# Patient Record
Sex: Male | Born: 1945 | Race: White | Hispanic: No | Marital: Married | Smoking: Former smoker
Health system: Southern US, Community
[De-identification: ages and names within clinical notes are randomized; demographics above are authoritative.]

## PROBLEM LIST (undated history)

## (undated) DIAGNOSIS — E039 Hypothyroidism, unspecified: Secondary | ICD-10-CM

## (undated) DIAGNOSIS — D649 Anemia, unspecified: Secondary | ICD-10-CM

## (undated) DIAGNOSIS — K219 Gastro-esophageal reflux disease without esophagitis: Secondary | ICD-10-CM

## (undated) DIAGNOSIS — M1990 Unspecified osteoarthritis, unspecified site: Secondary | ICD-10-CM

## (undated) DIAGNOSIS — C4490 Unspecified malignant neoplasm of skin, unspecified: Secondary | ICD-10-CM

## (undated) DIAGNOSIS — J90 Pleural effusion, not elsewhere classified: Secondary | ICD-10-CM

## (undated) DIAGNOSIS — I10 Essential (primary) hypertension: Secondary | ICD-10-CM

## (undated) DIAGNOSIS — F32A Depression, unspecified: Secondary | ICD-10-CM

## (undated) DIAGNOSIS — D696 Thrombocytopenia, unspecified: Secondary | ICD-10-CM

## (undated) DIAGNOSIS — D7589 Other specified diseases of blood and blood-forming organs: Secondary | ICD-10-CM

## (undated) DIAGNOSIS — I2510 Atherosclerotic heart disease of native coronary artery without angina pectoris: Secondary | ICD-10-CM

## (undated) DIAGNOSIS — E785 Hyperlipidemia, unspecified: Secondary | ICD-10-CM

## (undated) DIAGNOSIS — E119 Type 2 diabetes mellitus without complications: Secondary | ICD-10-CM

## (undated) DIAGNOSIS — D72819 Decreased white blood cell count, unspecified: Principal | ICD-10-CM

## (undated) DIAGNOSIS — F329 Major depressive disorder, single episode, unspecified: Secondary | ICD-10-CM

## (undated) HISTORY — DX: Gastro-esophageal reflux disease without esophagitis: K21.9

## (undated) HISTORY — DX: Atherosclerotic heart disease of native coronary artery without angina pectoris: I25.10

## (undated) HISTORY — DX: Hyperlipidemia, unspecified: E78.5

## (undated) HISTORY — DX: Thrombocytopenia, unspecified: D69.6

## (undated) HISTORY — DX: Decreased white blood cell count, unspecified: D72.819

## (undated) HISTORY — PX: UMBILICAL HERNIA REPAIR: SHX196

## (undated) HISTORY — DX: Essential (primary) hypertension: I10

## (undated) HISTORY — DX: Other specified diseases of blood and blood-forming organs: D75.89

## (undated) HISTORY — DX: Unspecified osteoarthritis, unspecified site: M19.90

## (undated) HISTORY — PX: HERNIA REPAIR: SHX51

## (undated) HISTORY — PX: SKIN CANCER EXCISION: SHX779

## (undated) HISTORY — DX: Hypothyroidism, unspecified: E03.9

---

## 1964-04-10 HISTORY — PX: TONSILLECTOMY: SUR1361

## 1998-02-19 ENCOUNTER — Ambulatory Visit (HOSPITAL_BASED_OUTPATIENT_CLINIC_OR_DEPARTMENT_OTHER): Admission: RE | Admit: 1998-02-19 | Discharge: 1998-02-19 | Payer: Self-pay | Admitting: *Deleted

## 1998-02-26 ENCOUNTER — Ambulatory Visit (HOSPITAL_COMMUNITY): Admission: RE | Admit: 1998-02-26 | Discharge: 1998-02-26 | Payer: Self-pay | Admitting: *Deleted

## 1998-06-09 ENCOUNTER — Other Ambulatory Visit: Admission: RE | Admit: 1998-06-09 | Discharge: 1998-06-09 | Payer: Self-pay | Admitting: Family Medicine

## 1998-07-24 ENCOUNTER — Inpatient Hospital Stay (HOSPITAL_COMMUNITY): Admission: EM | Admit: 1998-07-24 | Discharge: 1998-07-26 | Payer: Self-pay | Admitting: Emergency Medicine

## 1998-07-24 ENCOUNTER — Encounter: Payer: Self-pay | Admitting: Emergency Medicine

## 2002-08-11 ENCOUNTER — Encounter: Admission: RE | Admit: 2002-08-11 | Discharge: 2002-08-11 | Payer: Self-pay | Admitting: Family Medicine

## 2002-08-11 ENCOUNTER — Encounter: Payer: Self-pay | Admitting: Family Medicine

## 2003-12-08 ENCOUNTER — Encounter: Admission: RE | Admit: 2003-12-08 | Discharge: 2003-12-08 | Payer: Self-pay | Admitting: Family Medicine

## 2003-12-16 ENCOUNTER — Encounter: Admission: RE | Admit: 2003-12-16 | Discharge: 2004-03-15 | Payer: Self-pay | Admitting: Family Medicine

## 2006-08-07 ENCOUNTER — Encounter: Admission: RE | Admit: 2006-08-07 | Discharge: 2006-08-07 | Payer: Self-pay | Admitting: Family Medicine

## 2006-09-06 ENCOUNTER — Encounter: Admission: RE | Admit: 2006-09-06 | Discharge: 2006-09-06 | Payer: Self-pay | Admitting: Family Medicine

## 2009-01-06 ENCOUNTER — Inpatient Hospital Stay (HOSPITAL_BASED_OUTPATIENT_CLINIC_OR_DEPARTMENT_OTHER): Admission: RE | Admit: 2009-01-06 | Discharge: 2009-01-06 | Payer: Self-pay | Admitting: Interventional Cardiology

## 2009-01-08 HISTORY — PX: CORONARY ANGIOPLASTY WITH STENT PLACEMENT: SHX49

## 2009-01-11 ENCOUNTER — Ambulatory Visit (HOSPITAL_COMMUNITY): Admission: RE | Admit: 2009-01-11 | Discharge: 2009-01-12 | Payer: Self-pay | Admitting: Interventional Cardiology

## 2010-03-27 ENCOUNTER — Emergency Department (HOSPITAL_COMMUNITY)
Admission: EM | Admit: 2010-03-27 | Discharge: 2010-03-27 | Payer: Self-pay | Source: Home / Self Care | Admitting: Emergency Medicine

## 2010-04-05 ENCOUNTER — Emergency Department (HOSPITAL_COMMUNITY)
Admission: EM | Admit: 2010-04-05 | Discharge: 2010-04-05 | Payer: Self-pay | Source: Home / Self Care | Admitting: Family Medicine

## 2010-05-01 ENCOUNTER — Encounter: Payer: Self-pay | Admitting: Family Medicine

## 2010-07-14 LAB — GLUCOSE, CAPILLARY
Glucose-Capillary: 103 mg/dL — ABNORMAL HIGH (ref 70–99)
Glucose-Capillary: 155 mg/dL — ABNORMAL HIGH (ref 70–99)
Glucose-Capillary: 179 mg/dL — ABNORMAL HIGH (ref 70–99)
Glucose-Capillary: 208 mg/dL — ABNORMAL HIGH (ref 70–99)

## 2010-07-14 LAB — BASIC METABOLIC PANEL
BUN: 14 mg/dL (ref 6–23)
Creatinine, Ser: 0.79 mg/dL (ref 0.4–1.5)
GFR calc non Af Amer: >60 mL/min (ref >60)

## 2010-07-14 LAB — CBC
Platelets: 61 10*3/uL — ABNORMAL LOW (ref 150–400)
WBC: 4.4 10*3/uL (ref 4.0–10.5)

## 2010-08-31 ENCOUNTER — Ambulatory Visit (HOSPITAL_COMMUNITY)
Admission: RE | Admit: 2010-08-31 | Payer: Medicare Other | Source: Ambulatory Visit | Admitting: Interventional Cardiology

## 2011-08-29 ENCOUNTER — Telehealth: Payer: Self-pay | Admitting: Oncology

## 2011-08-29 NOTE — Telephone Encounter (Signed)
Talked to pt, gave him appt time and location, he is aware of appt

## 2011-08-30 ENCOUNTER — Telehealth: Payer: Self-pay | Admitting: Oncology

## 2011-08-30 NOTE — Telephone Encounter (Signed)
Referred by Dr. Robert Ehinger Dx- Leukopenia/Thrombocytopenia 

## 2011-08-31 ENCOUNTER — Telehealth: Payer: Self-pay | Admitting: Oncology

## 2011-08-31 NOTE — Telephone Encounter (Signed)
Referred by Dr. Blair Heys Dx- Leukopenia/Thrombocytopenia

## 2011-09-01 ENCOUNTER — Encounter: Payer: Self-pay | Admitting: Oncology

## 2011-09-01 ENCOUNTER — Other Ambulatory Visit: Payer: Self-pay | Admitting: Oncology

## 2011-09-01 DIAGNOSIS — D696 Thrombocytopenia, unspecified: Secondary | ICD-10-CM | POA: Insufficient documentation

## 2011-09-01 DIAGNOSIS — D7589 Other specified diseases of blood and blood-forming organs: Secondary | ICD-10-CM

## 2011-09-01 DIAGNOSIS — D72819 Decreased white blood cell count, unspecified: Secondary | ICD-10-CM

## 2011-09-01 HISTORY — DX: Thrombocytopenia, unspecified: D69.6

## 2011-09-01 HISTORY — DX: Other specified diseases of blood and blood-forming organs: D75.89

## 2011-09-01 HISTORY — DX: Decreased white blood cell count, unspecified: D72.819

## 2011-09-06 ENCOUNTER — Ambulatory Visit: Payer: Medicare Other

## 2011-09-06 ENCOUNTER — Ambulatory Visit (HOSPITAL_BASED_OUTPATIENT_CLINIC_OR_DEPARTMENT_OTHER): Payer: Medicare Other | Admitting: Oncology

## 2011-09-06 ENCOUNTER — Encounter: Payer: Self-pay | Admitting: Oncology

## 2011-09-06 ENCOUNTER — Telehealth: Payer: Self-pay | Admitting: Oncology

## 2011-09-06 ENCOUNTER — Other Ambulatory Visit (HOSPITAL_BASED_OUTPATIENT_CLINIC_OR_DEPARTMENT_OTHER): Payer: Medicare Other | Admitting: Lab

## 2011-09-06 ENCOUNTER — Telehealth: Payer: Self-pay

## 2011-09-06 VITALS — BP 148/84 | HR 76 | Temp 97.0°F | Ht 67.5 in | Wt 240.7 lb

## 2011-09-06 DIAGNOSIS — D7589 Other specified diseases of blood and blood-forming organs: Secondary | ICD-10-CM

## 2011-09-06 DIAGNOSIS — D696 Thrombocytopenia, unspecified: Secondary | ICD-10-CM

## 2011-09-06 DIAGNOSIS — D72819 Decreased white blood cell count, unspecified: Secondary | ICD-10-CM

## 2011-09-06 LAB — MORPHOLOGY: RBC Comments: NORMAL

## 2011-09-06 LAB — CBC & DIFF AND RETIC
BASO%: 0.6 % (ref 0.0–2.0)
EOS%: 2.4 % (ref 0.0–7.0)
LYMPH%: 26.5 % (ref 14.0–49.0)
MCH: 31.2 pg (ref 27.2–33.4)
MCHC: 34.8 g/dL (ref 32.0–36.0)
MONO#: 0.3 10*3/uL (ref 0.1–0.9)
Platelets: 74 10*3/uL — ABNORMAL LOW (ref 140–400)
RBC: 4.68 10*6/uL (ref 4.20–5.82)
Retic %: 1.84 % — ABNORMAL HIGH (ref 0.80–1.80)
WBC: 3.3 10*3/uL — ABNORMAL LOW (ref 4.0–10.3)
lymph#: 0.9 10*3/uL (ref 0.9–3.3)

## 2011-09-06 NOTE — Telephone Encounter (Signed)
Gv pt appt for nov2013 °

## 2011-09-06 NOTE — Progress Notes (Signed)
New patient today, patient has insurance, patient stated that at this time because he has insurance he did not think he needed any assistance gave patient my contact information if anything changes.

## 2011-09-06 NOTE — Progress Notes (Signed)
New Patient Hematology-Oncology Evaluation   Randy Macdonald [id number] 02-01-1946 66 y.o. 09/06/2011  CC: Dr. 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 Laboratory data provided back as far as 12/30/2008 shows a mild leukopenia with white count 3600 and mild thrombocytopenia with platelet count 74,000 with a normal hemoglobin of 14.6 g. Multiple lab values over the last 3 years since that time have been similar. In October 2010 white count 3300, platelets 88,000, in September 2011 white count 4100, platelets 69,000; in may of 2012 white count 3100, platelets 63,000. On 08/22/2011 white count 2800, platelets 62,000, MCV 95, hemoglobin 14, hematocrit 42%. In our office today, hemoglobin 14.6, hematocrit 41.9, MCV 89.5, white count 3300, 61% neutrophils, 27% lymphocytes, 9 monocytes, 2 eosinophils, platelet count 74,000, reticulocyte count 1.8%. A constant feature has been his normal hemoglobin and, although decreased, white count and platelet count have not shown a trend for progressive decline over time. He has a normal white count differential. He denies any history of a severe viral infection, hepatitis, yellow jaundice, malaria, and liver functions have been normal. He had some recent gastrointestinal symptoms with constipation and increased gas. He takes diclofenac for degenerative arthritis. He was instructed to stop this medication and his GI symptoms promptly resolved. Regular x-rays of the abdomen were unremarkable. He has degenerative arthritis primarily affecting his back. He has no signs or symptoms of a collagen vascular disorder. He does fatigue easily but has no other constitutional symptoms and has not lost any weight. He has not noted any easy  bruising, any abnormal bleeding, any swollen glands. He has had heavy exposure to diesel fuel and organic solvents over the years in his job as a Curator. He is a nonsmoker. He does not use alcohol. There is no family history of any blood disorder. He has no history of blood transfusions.   PMH: Past Medical History  Diagnosis Date  . Leukopenia 09/01/2011  . Thrombocytopenia 09/01/2011  . Macrocytosis without anemia 09/01/2011  No history of myocardial infarction, he has a hiatus hernia with reflux, hypothyroid on replacement, degenerative arthritis. Coronary artery disease status post coronary stent x2 approximately 2011, insulin-dependent diabetes. Depression.   surgical history: Tonsillectomy at age 65. Umbilicus hernia repair with removal of his navel.  Allergies: Allergies  Allergen Reactions  . Codeine Nausea And Vomiting  . Sulfa Antibiotics Nausea And Vomiting    Medications: Celexa order milligrams daily, Plavix 75 mg daily, Crestor 40 mg daily, Cozaar 50 mg daily, long acting and short acting insulin doses not recorded.   Social History: Married. Retired Curator. No alcohol. No tobacco. Son age 33 daughter age 35 healthy. His brother-in-law is Carnella Guadalajara who runs the music aren't here in [place].  Family History: He has a brother who had bypass surgery for coronary artery disease. A sister has fibromyalgia. There is no family history of any bleeding disorder.  Review of Systems: Constitutional symptoms: He fatigues easily. No weight loss. No fevers. No night sweats. HEENT: He gets a strange buzzing sensation in his ears left greater than right at night when he lays his head on the pillow. Respiratory: No dyspnea on exertion but not at rest. No PND. No orthopnea. Cardiovascular:  No chest pain or palpitations since coronary stents placed 2 years ago Gastrointestinal  ROS: Recent GI symptoms as noted above have now resolved. Remote colonoscopy about 15 years ago with  removal of a benign polyp. He wound up in the hospital with colitis for a few days after the procedure. Genito-Urinary ROS: No urinary tract symptoms. Nocturia x1. Hematological and Lymphatic: Musculoskeletal: See above. He does get some migratory myalgias particularly in the muscles of his left forearm Neurologic: No headache or change in vision, Dermatologic: No rash or ecchymoses. Miscellaneous: No history of recurrent infections. Remaining ROS negative.  Physical Exam: Blood pressure 148/84, pulse 76, temperature 97 F (36.1 C), temperature source Oral, height 5' 7.5" (1.715 m), weight 240 lb 11.2 oz (109.181 kg). Wt Readings from Last 3 Encounters:  09/06/11 240 lb 11.2 oz (109.181 kg)    General appearance: Well-nourished Caucasian man Head:  normal Neck: Full range of motion Lymph nodes: No cervical supraclavicular axillary or inguinal adenopathy Breasts: Lungs: Clear to auscultation resonant to percussion Heart: Regular rhythm no murmur Abdominal: Obese nontender, surgically absent navel, no obvious mass or organomegaly GU: Not examined Extremities: No edema no calf tenderness Neurologic: Mental status intact, cranial nerves intact, 2 pills reactive to light, optic disc sharp, motor strength 5 over 5, reflexes 1+ symmetric, moderate to severe decrease in vibration sensation over the fingertips by tuning fork exam Skin: No rash or ecchymoses    Lab Results: Lab Results  Component Value Date   WBC 3.3* 09/06/2011   HGB 14.6 09/06/2011   HCT 41.9 09/06/2011   MCV 89.5 09/06/2011   PLT 74* 09/06/2011     Chemistry      Component Value Date/Time   NA 136 01/12/2009 0425   K 3.7 01/12/2009 0425   CL 103 01/12/2009 0425   CO2 27 01/12/2009 0425   BUN 14 01/12/2009 0425   CREATININE 0.79 01/12/2009 0425      Component Value Date/Time   CALCIUM 8.5 01/12/2009 0425       Review of peripheral blood film:  Normochromic normocytic red cells, mature neutrophils and lymphocytes,  a subpopulation of benign reactive lymphocytes of the large granular type seen with previous or current viral infection. Decreased platelets with normal morphology and granulation. No immature cells.    Impression and Plan: Chronic mild leukopenia and thrombocytopenia without concomitant anemia with no trend for progressive decline over a three-year interval of observation. Normal white count differential. He is asymptomatic and has not had any problems with bleeding or recurrent infection. Review of the peripheral blood is unremarkable.  Etiology of his cytopenias is unclear. It would be very unusual not to have anemia with a underlying myelodysplastic syndrome or multiple myeloma. Although non-Hodgkin's lymphoma would be in my differential, he has no peripheral lymphadenopathy, no organomegaly on exam, and no progressive symptoms over the last 3 years to suggest this. He has had significant exposure to toxic chemicals over the years. However, as stated above, it would be unusual to develop a myelodysplastic syndrome and  not have an associated anemia as well. He is on a number of medications and medication effects on the bone marrow must always be considered. Isolated cytopenias can be seen with many medications. Although none the medicines he is on now are obvious usual offenders, I would consider tapering then stopping his Celexa for one month to see if this has any effect on his blood production.  We discussed the value of doing a bone marrow biopsy and a CT scan of his abdomen at this time. Since he  is asymptomatic  and we have not seen progressive changes in his blood counts now for over 3 years, we mutually decided to wait for 6 months and reevaluate.      Levert Feinstein, MD 09/06/2011, 1:24 PM

## 2011-09-06 NOTE — Telephone Encounter (Signed)
Pt notified of results per Dr Cyndie Chime.  dph

## 2011-09-06 NOTE — Telephone Encounter (Signed)
Message copied by Albertha Ghee on Wed Sep 06, 2011  4:13 PM ------      Message from: Levert Feinstein      Created: Wed Sep 06, 2011  3:55 PM       Call pt  [phone]  He is a known diabetic on insulin - he will know what to do  [employer]

## 2011-09-07 LAB — COMPREHENSIVE METABOLIC PANEL
ALT: 37 U/L (ref 0–53)
AST: 27 U/L (ref 0–37)
Albumin: 4.2 g/dL (ref 3.5–5.2)
Alkaline Phosphatase: 64 U/L (ref 39–117)
Glucose, Bld: 270 mg/dL — ABNORMAL HIGH (ref 70–99)
Potassium: 4.2 mEq/L (ref 3.5–5.3)
Sodium: 137 mEq/L (ref 135–145)
Total Bilirubin: 1 mg/dL (ref 0.3–1.2)
Total Protein: 6.4 g/dL (ref 6.0–8.3)

## 2011-09-07 LAB — SEDIMENTATION RATE: Sed Rate: 1 mm/hr (ref 0–16)

## 2012-03-05 ENCOUNTER — Ambulatory Visit: Payer: Medicare Other | Admitting: Oncology

## 2012-03-05 ENCOUNTER — Other Ambulatory Visit: Payer: Medicare Other | Admitting: Lab

## 2012-05-06 ENCOUNTER — Telehealth: Payer: Self-pay | Admitting: Oncology

## 2012-05-06 ENCOUNTER — Other Ambulatory Visit (HOSPITAL_BASED_OUTPATIENT_CLINIC_OR_DEPARTMENT_OTHER): Payer: Medicare Other | Admitting: Lab

## 2012-05-06 ENCOUNTER — Ambulatory Visit (HOSPITAL_BASED_OUTPATIENT_CLINIC_OR_DEPARTMENT_OTHER): Payer: Medicare Other | Admitting: Oncology

## 2012-05-06 VITALS — BP 153/85 | HR 81 | Temp 97.8°F | Resp 20 | Ht 67.5 in | Wt 247.5 lb

## 2012-05-06 DIAGNOSIS — D696 Thrombocytopenia, unspecified: Secondary | ICD-10-CM

## 2012-05-06 DIAGNOSIS — D72819 Decreased white blood cell count, unspecified: Secondary | ICD-10-CM

## 2012-05-06 LAB — CBC WITH DIFFERENTIAL/PLATELET
Basophils Absolute: 0 10*3/uL (ref 0.0–0.1)
Eosinophils Absolute: 0.1 10*3/uL (ref 0.0–0.5)
HCT: 42.3 % (ref 38.4–49.9)
HGB: 14.6 g/dL (ref 13.0–17.1)
LYMPH%: 25.8 % (ref 14.0–49.0)
MCV: 90.4 fL (ref 79.3–98.0)
MONO#: 0.4 10*3/uL (ref 0.1–0.9)
MONO%: 11 % (ref 0.0–14.0)
NEUT#: 2.2 10*3/uL (ref 1.5–6.5)
Platelets: 61 10*3/uL — ABNORMAL LOW (ref 140–400)
WBC: 3.7 10*3/uL — ABNORMAL LOW (ref 4.0–10.3)

## 2012-05-06 LAB — MORPHOLOGY: PLT EST: DECREASED

## 2012-05-06 LAB — CHCC SMEAR

## 2012-05-06 NOTE — Patient Instructions (Signed)
Return visit in September

## 2012-05-06 NOTE — Telephone Encounter (Signed)
Gave pt appt for lab and MD for September 2014

## 2012-05-06 NOTE — Progress Notes (Signed)
Hematology and Oncology Follow Up Visit  Randy Macdonald [id number] 10-21-1945 66 y.o. 05/06/2012 8:12 PM   Principle Diagnosis: Encounter Diagnoses  Name Primary?  . Leukopenia Yes  . Thrombocytopenia      Interim History:   followup visit for this pleasant 67 year old man I initially evaluated in May 2013 for chronic leukopenia and thrombocytopenia. Blood counts dating back as far as October 2010 showed normal hemoglobin but decreased white count 3300 and platelet count 88,000. I was able to find an additional blood count in the system from 07/24/1998:  hemoglobin was 17.6, hematocrit 50, white count 7200, and platelet count 59,000. This demonstrates that his platelet count has been at the current level for 14 years now. It is not clear when his white count fell but at least over the last 3 years values have been stable. He has a normal differential. No immature cells on review of the peripheral blood film. He has no signs or symptoms of a collagen vascular disorder. ANA was negative. ESR 1 mm. No known history of significant viral exposure, normal liver functions. No history of hepatitis yellow jaundice or malaria. We elected not to do an immediate bone marrow biopsy but continued to follow his counts. CBC today is stable compare with previous values with hemoglobin 14.6, hematocrit 42, MCV 90, white count 3700, 61 neutrophils, 26 lymphocytes, 11 monocytes, and platelet count 61,000.  He has had no interim medical problems.  Medications: reviewed  Allergies:  Allergies  Allergen Reactions  . Codeine Nausea And Vomiting  . Sulfa Antibiotics Nausea And Vomiting    Physical Exam: No exam was done today Blood pressure 153/85, pulse 81, temperature 97.8 F (36.6 C), temperature source Oral, resp. rate 20, height 5' 7.5" (1.715 m), weight 247 lb 8 oz (112.265 kg). Wt Readings from Last 3 Encounters:  05/06/12 247 lb 8 oz (112.265 kg)  09/06/11 240 lb 11.2 oz (109.181 kg)    Lab  Results: Lab Results  Component Value Date   WBC 3.7* 05/06/2012   HGB 14.6 05/06/2012   HCT 42.3 05/06/2012   MCV 90.4 05/06/2012   PLT 61* 05/06/2012     Chemistry      Component Value Date/Time   NA 137 09/06/2011 0948   K 4.2 09/06/2011 0948   CL 102 09/06/2011 0948   CO2 24 09/06/2011 0948   BUN 15 09/06/2011 0948   CREATININE 0.76 09/06/2011 0948      Component Value Date/Time   CALCIUM 9.1 09/06/2011 0948   ALKPHOS 64 09/06/2011 0948   AST 27 09/06/2011 0948   ALT 37 09/06/2011 0948   BILITOT 1.0 09/06/2011 0948       Impression and Plan: Chronic leukopenia and thrombocytopenia without anemia. Etiology remains unclear but counts remained at his baseline. I will continue to follow him periodically over time. Bone marrow biopsy if any trend for deterioration in his counts.   CC:.    Levert Feinstein, MD 1/27/20148:12 PM

## 2012-11-05 ENCOUNTER — Other Ambulatory Visit: Payer: Medicare Other | Admitting: Lab

## 2013-01-02 ENCOUNTER — Telehealth: Payer: Self-pay | Admitting: Oncology

## 2013-01-02 NOTE — Telephone Encounter (Signed)
Pt has to be in court, did not r/s appt,nurse notified

## 2013-01-06 ENCOUNTER — Ambulatory Visit: Payer: Medicare Other | Admitting: Oncology

## 2013-01-06 ENCOUNTER — Other Ambulatory Visit: Payer: Medicare Other | Admitting: Lab

## 2013-01-14 ENCOUNTER — Telehealth: Payer: Self-pay | Admitting: *Deleted

## 2013-01-14 DIAGNOSIS — D72819 Decreased white blood cell count, unspecified: Secondary | ICD-10-CM

## 2013-01-14 DIAGNOSIS — D696 Thrombocytopenia, unspecified: Secondary | ICD-10-CM

## 2013-01-14 MED ORDER — LOSARTAN POTASSIUM 100 MG PO TABS: 100.0000 mg | ORAL_TABLET | Freq: Every day | ORAL | Status: DC

## 2013-01-14 NOTE — Telephone Encounter (Signed)
Pt. called requesting 90 day refill on Losartan to be sent to [facility] on [address].

## 2013-01-14 NOTE — Telephone Encounter (Signed)
Refilled medication

## 2013-01-21 ENCOUNTER — Ambulatory Visit: Payer: Medicare Other | Admitting: Interventional Cardiology

## 2013-01-24 ENCOUNTER — Encounter: Payer: Self-pay | Admitting: *Deleted

## 2013-01-24 ENCOUNTER — Encounter: Payer: Self-pay | Admitting: Interventional Cardiology

## 2013-01-24 DIAGNOSIS — E785 Hyperlipidemia, unspecified: Secondary | ICD-10-CM | POA: Insufficient documentation

## 2013-01-24 DIAGNOSIS — I2510 Atherosclerotic heart disease of native coronary artery without angina pectoris: Secondary | ICD-10-CM | POA: Insufficient documentation

## 2013-01-24 DIAGNOSIS — E039 Hypothyroidism, unspecified: Secondary | ICD-10-CM | POA: Insufficient documentation

## 2013-01-24 DIAGNOSIS — K219 Gastro-esophageal reflux disease without esophagitis: Secondary | ICD-10-CM | POA: Insufficient documentation

## 2013-01-24 DIAGNOSIS — E119 Type 2 diabetes mellitus without complications: Secondary | ICD-10-CM | POA: Insufficient documentation

## 2013-01-28 ENCOUNTER — Ambulatory Visit (INDEPENDENT_AMBULATORY_CARE_PROVIDER_SITE_OTHER): Payer: Medicare Other | Admitting: Interventional Cardiology

## 2013-01-28 ENCOUNTER — Encounter: Payer: Self-pay | Admitting: Interventional Cardiology

## 2013-01-28 VITALS — BP 118/68 | HR 75 | Ht 67.0 in | Wt 243.8 lb

## 2013-01-28 DIAGNOSIS — E669 Obesity, unspecified: Secondary | ICD-10-CM

## 2013-01-28 DIAGNOSIS — I2510 Atherosclerotic heart disease of native coronary artery without angina pectoris: Secondary | ICD-10-CM

## 2013-01-28 DIAGNOSIS — E785 Hyperlipidemia, unspecified: Secondary | ICD-10-CM

## 2013-01-28 DIAGNOSIS — I10 Essential (primary) hypertension: Secondary | ICD-10-CM | POA: Insufficient documentation

## 2013-01-28 NOTE — Patient Instructions (Signed)
Your physician wants you to follow-up in: 1 year with Dr. Varanasi. You will receive a reminder letter in the mail two months in advance. If you don't receive a letter, please call our office to schedule the follow-up appointment.  Your physician recommends that you continue on your current medications as directed. Please refer to the Current Medication list given to you today.  

## 2013-01-28 NOTE — Progress Notes (Signed)
Patient ID: Randy Macdonald, male   DOB: 02-08-1946, 67 y.o.   MRN: [id number]    [address] [place], [place]  [address] Phone: [phone] Fax:  [phone]  Date:  01/28/2013   ID:  Randy Macdonald, DOB [birth year], MRN [id number]  PCP:  Thora Lance, MD      History of Present Illness: Randy Macdonald is a 67 y.o. male who ahs had CAD and HTN.  He has had an LAD DES several years ago. He has not been checking his BP. He reports easy bruising and nuisance bleeding. No blood in his stools. His stomach feels better after using metamucil.  He has a brother in law that stays with him, who has memory isses.  THis is a source of stress.  CAD/ASCVD:  He has felt very well. He walks regularly. Denies : Chest pain.  Diaphoresis.  Dizziness.  Fatigue.  Leg edema.  Nitroglycerin.  Orthopnea.  Palpitations.  Shortness of breath.  Syncope.     Wt Readings from Last 3 Encounters:  01/28/13 243 lb 12.8 oz (110.587 kg)  05/06/12 247 lb 8 oz (112.265 kg)  09/06/11 240 lb 11.2 oz (109.181 kg)     Past Medical History  Diagnosis Date  . Leukopenia 09/01/2011  . Thrombocytopenia 09/01/2011  . Macrocytosis without anemia 09/01/2011  . Hyperlipidemia   . Coronary atherosclerosis of native coronary artery   . Hypothyroidism   . Diabetes   . GERD (gastroesophageal reflux disease)   . HTN (hypertension)   . Osteoarthritis     Current Outpatient Prescriptions  Medication Sig Dispense Refill  . aspirin 81 MG tablet Take 81 mg by mouth daily.      . citalopram (CELEXA) 40 MG tablet 40 mg daily.       . CRESTOR 40 MG tablet 40 mg daily.       . diclofenac (VOLTAREN) 75 MG EC tablet Take 75 mg by mouth daily.      . fish oil-omega-3 fatty acids 1000 MG capsule 1,200 mg daily.      Marland Kitchen levothyroxine (SYNTHROID, LEVOTHROID) 75 MCG tablet Take 75 mcg by mouth daily.      Marland Kitchen losartan (COZAAR) 100 MG tablet Take 1 tablet (100 mg total) by mouth daily.  90 tablet  1  . metFORMIN  (GLUCOPHAGE-XR) 500 MG 24 hr tablet Take 500 mg by mouth 2 (two) times daily.       Marland Kitchen NOVOLIN N RELION 100 UNIT/ML injection Inject 75 Units into the skin daily before breakfast.       . NOVOLIN R RELION 100 UNIT/ML injection Inject 60 Units into the skin 3 (three) times daily before meals.        No current facility-administered medications for this visit.    Allergies:    Allergies  Allergen Reactions  . Codeine Nausea And Vomiting  . Sulfa Antibiotics Nausea And Vomiting    Social History:  The patient  reports that he has never smoked. He does not have any smokeless tobacco history on file.   Family History:  The patient's family history includes CAD in his brother; Thyroid disease in his sister.   ROS:  Please see the history of present illness.  No nausea, vomiting.  No fevers, chills.  No focal weakness.  No dysuria.    All other systems reviewed and negative.   PHYSICAL EXAM: VS:  BP 118/68  Pulse 75  Ht 5\' 7"  (1.702 m)  Wt  243 lb 12.8 oz (110.587 kg)  BMI 38.18 kg/m2 Well nourished, well developed, in no acute distress HEENT: normal Neck: no JVD, no carotid bruits Cardiac:  normal S1, S2; RRR;  Lungs:  clear to auscultation bilaterally, no wheezing, rhonchi or rales Abd: soft, nontender, no hepatomegaly, obese Ext: no edema Skin: warm and dry Neuro:   no focal abnormalities noted  EKG:  Normal   ASSESSMENT AND PLAN:  Coronary atherosclerosis of native coronary artery  Start Aspirin Tablet, 81 MG, 1 tablet, Orally, every other day Stop Plavix Tablet, 75 MG, 1 tablet, Orally, Once a day Diagnostic Imaging:EKG Harward,Amy 01/19/2012 02:07:59 PM > Kariem Wolfson,JAY 01/19/2012 02:31:26 PM > normal  Angina resolved even with heavy labor.  It has been four years since stent. He had a lot of nuisance bleeding on plavix. switched back to aspirin.    2. Mixed hyperlipidemia  Continue Crestor Tablet, 40 mg, 1 tablet, Orally, Once a day Continue Fish Oil Capsule, 1200 MG, 2  capsules, Orally, twice a day TG better on last check. LDL controlled , 84 in 3/13. LDL 96; TG 162 in 3/14.   3. Essential hypertension, benign   continue Losartan Potassium Tablet, 100 MG, 1 tablet, Orally, Once a day, 30 day(s), 30 Target systolic BP < 130/80. cough resolved.    4. Obesity, unspecified  COntinue to try to lose weight with diet and exericise. I would recommend that he tries to exercise.    Preventive Medicine  Adult topics discussed:  Diet: weight loss, healthy diet.  Exercise: 5 days a week, at least 30 minutes of aerobic exercise.      Signed, Fredric Mare, MD, [facility] 01/28/2013 4:48 PM

## 2013-02-26 ENCOUNTER — Telehealth: Payer: Self-pay | Admitting: Interventional Cardiology

## 2013-02-26 NOTE — Telephone Encounter (Signed)
Spoke with patient to advise him that cardiac stents do not cause a problem with MRI and to make certain the facility is aware that he has these which were implanted in 2010.  Patient verbalized understanding and agreement.

## 2013-02-26 NOTE — Telephone Encounter (Signed)
New message      Have stents----need MRI of knee---can he have an MRI with stents?

## 2013-03-24 ENCOUNTER — Telehealth: Payer: Self-pay | Admitting: Interventional Cardiology

## 2013-03-24 NOTE — Telephone Encounter (Signed)
Received request from Nurse fax box, documents faxed for surgical clearance. To: [facility] Fax number: [phone] Attention: 03/24/13/KM

## 2013-05-14 ENCOUNTER — Telehealth: Payer: Self-pay | Admitting: Interventional Cardiology

## 2013-05-14 NOTE — Telephone Encounter (Signed)
I spoke with patient after I spoke with his insurance company, and explained that he has a $165 deductible, so he will pay $210 for the first 30 day supply, but every month afterwards it will cost him $45 like it did in the past.  He failed simvastaitn and atorvastatin (muscle aches) in the past.  Patient states that shouldn't be a problem, and he will go pick up his Crestor 40 mg today and get back on it.

## 2013-05-14 NOTE — Telephone Encounter (Signed)
Called pt to let him know there is not a generic crestor at this time. Pt didn't get crestor from the pharmacy because the pharmacy told him that it would cost him $208 for first rx but then possibly go down. Randy Macdonald, please advise.

## 2013-05-14 NOTE — Telephone Encounter (Signed)
New message  Patient wants to know if it is okay for him to take generic crestor? Please call and advise.

## 2013-05-16 ENCOUNTER — Other Ambulatory Visit (HOSPITAL_COMMUNITY): Payer: Medicare Other

## 2013-05-26 ENCOUNTER — Inpatient Hospital Stay: Admit: 2013-05-26 | Payer: Self-pay | Admitting: Orthopedic Surgery

## 2013-05-26 SURGERY — ARTHROPLASTY, KNEE, UNICOMPARTMENTAL
Anesthesia: General | Laterality: Right

## 2013-07-22 ENCOUNTER — Other Ambulatory Visit: Payer: Self-pay

## 2013-07-22 DIAGNOSIS — D696 Thrombocytopenia, unspecified: Secondary | ICD-10-CM

## 2013-07-22 DIAGNOSIS — D72819 Decreased white blood cell count, unspecified: Secondary | ICD-10-CM

## 2013-07-22 MED ORDER — LOSARTAN POTASSIUM 100 MG PO TABS: 100.0000 mg | ORAL_TABLET | Freq: Every day | ORAL | Status: DC

## 2014-01-05 ENCOUNTER — Other Ambulatory Visit: Payer: Self-pay | Admitting: Family Medicine

## 2014-01-05 DIAGNOSIS — R51 Headache: Secondary | ICD-10-CM

## 2014-01-12 ENCOUNTER — Ambulatory Visit
Admission: RE | Admit: 2014-01-12 | Discharge: 2014-01-12 | Disposition: A | Discharge status: Home / Self Care | Payer: Medicare Other | Source: Ambulatory Visit | Attending: Family Medicine | Admitting: Family Medicine

## 2014-01-12 DIAGNOSIS — R51 Headache: Secondary | ICD-10-CM

## 2014-01-12 MED ORDER — IOHEXOL 300 MG/ML  SOLN
75.0000 mL | Freq: Once | INTRAMUSCULAR | Status: AC | PRN
  Administered 2014-01-12: 75 mL via INTRAVENOUS

## 2014-01-30 ENCOUNTER — Ambulatory Visit: Payer: Medicare Other | Admitting: Interventional Cardiology

## 2014-02-05 ENCOUNTER — Ambulatory Visit: Payer: Medicare Other | Admitting: Interventional Cardiology

## 2014-03-11 ENCOUNTER — Encounter: Payer: Self-pay | Admitting: Interventional Cardiology

## 2014-03-11 ENCOUNTER — Ambulatory Visit (INDEPENDENT_AMBULATORY_CARE_PROVIDER_SITE_OTHER): Payer: Medicare Other | Admitting: Interventional Cardiology

## 2014-03-11 VITALS — BP 132/68 | HR 77 | Ht 67.0 in | Wt 239.8 lb

## 2014-03-11 DIAGNOSIS — E669 Obesity, unspecified: Secondary | ICD-10-CM

## 2014-03-11 DIAGNOSIS — I25119 Atherosclerotic heart disease of native coronary artery with unspecified angina pectoris: Secondary | ICD-10-CM

## 2014-03-11 DIAGNOSIS — E785 Hyperlipidemia, unspecified: Secondary | ICD-10-CM

## 2014-03-11 DIAGNOSIS — I10 Essential (primary) hypertension: Secondary | ICD-10-CM

## 2014-03-11 NOTE — Progress Notes (Signed)
Patient ID: Randy Macdonald, male   DOB: [birth year], 68 y.o.   MRN: [id number] Patient ID: BLAZE NYLUND, male   DOB: [birth year], 68 y.o.   MRN: [id number]    [address] [place], [place]  [address] Phone: [phone] Fax:  [phone]  Date:  03/11/2014   ID:  SAHMIR WEATHERBEE, DOB May 31, 1945, MRN [id number]  PCP:  Simona Huh, MD      History of Present Illness: EVANS LEVEE is a 68 y.o. male who ahs had CAD and HTN.  He has had an LAD DES in 2010. He has not been checking his BP at home. He reports mild bruising and nuisance bleeding, even on aspirin. No blood in his stools.  CAD/ASCVD:  He has felt very well. He walks regularly. He has had throat tightness when he starts walking up the hill.  It gets better with rest.  THis is similar to his sx prior to his stent in 2010.  Of note, he had a false negative cardiolite stress test at that time.   Denies : Diaphoresis.  Dizziness.   Nitroglycerin.  Orthopnea.  Palpitations.  Syncope.     Wt Readings from Last 3 Encounters:  03/11/14 239 lb 12.8 oz (108.773 kg)  01/28/13 243 lb 12.8 oz (110.587 kg)  05/06/12 247 lb 8 oz (112.265 kg)     Past Medical History  Diagnosis Date  . Leukopenia 09/01/2011  . Thrombocytopenia 09/01/2011  . Macrocytosis without anemia 09/01/2011  . Hyperlipidemia   . Coronary atherosclerosis of native coronary artery   . Hypothyroidism   . Diabetes   . GERD (gastroesophageal reflux disease)   . HTN (hypertension)   . Osteoarthritis     Current Outpatient Prescriptions  Medication Sig Dispense Refill  . aspirin 81 MG tablet Take 81 mg by mouth daily.    . citalopram (CELEXA) 40 MG tablet 40 mg daily.     . CRESTOR 40 MG tablet 40 mg daily.     . diclofenac (VOLTAREN) 75 MG EC tablet Take 75 mg by mouth daily.    . fish oil-omega-3 fatty acids 1000 MG capsule 1,200 mg daily.    . fluorouracil (EFUDEX) 5 % cream Apply topically as needed (Rash----Per patient).     Marland Kitchen levothyroxine  (SYNTHROID, LEVOTHROID) 75 MCG tablet Take 75 mcg by mouth daily.    Marland Kitchen losartan (COZAAR) 100 MG tablet Take 1 tablet (100 mg total) by mouth daily. 90 tablet 1  . metFORMIN (GLUCOPHAGE-XR) 500 MG 24 hr tablet Take 500 mg by mouth 2 (two) times daily.     Marland Kitchen NOVOLIN N RELION 100 UNIT/ML injection Inject 75 Units into the skin daily before breakfast.     . NOVOLIN R RELION 100 UNIT/ML injection Inject 60 Units into the skin 3 (three) times daily before meals.      No current facility-administered medications for this visit.    Allergies:    Allergies  Allergen Reactions  . Codeine Nausea And Vomiting  . Sulfa Antibiotics Nausea And Vomiting    Social History:  The patient  reports that he has never smoked. He does not have any smokeless tobacco history on file.   Family History:  The patient's family history includes CAD in his brother; Thyroid disease in his sister.   ROS:  Please see the history of present illness.  No nausea, vomiting.  No fevers, chills.  No focal weakness.  No dysuria.    All other  systems reviewed and negative.   PHYSICAL EXAM: VS:  BP 132/68 mmHg  Pulse 77  Ht 5\' 7"  (1.702 m)  Wt 239 lb 12.8 oz (108.773 kg)  BMI 37.55 kg/m2 Well nourished, well developed, in no acute distress HEENT: normal Neck: no JVD, no carotid bruits Cardiac:  normal S1, S2; RRR;  Lungs:  clear to auscultation bilaterally, no wheezing, rhonchi or rales Abd: soft, nontender, no hepatomegaly, obese Ext: mild edema edema Skin: warm and dry Neuro:   no focal abnormalities noted Psych: normal affect  EKG:  Normal   ASSESSMENT AND PLAN:  Coronary atherosclerosis of native coronary artery  Start Aspirin Tablet, 81 MG, 1 tablet, Orally, every other day Stopped Plavix Tablet, 75 MG, 1 tablet, Orally, Once a day  Angina returned as noted above. Worrisome for recurrent CAD.  He has had these sx on and of for a few years, but they have consistently returned now.  He is having to stop  activity due to the upper chest and throat tightness with activity.  He wants to know what the cost of the catheterization will be out of his pocket prior to scheduling.  It has been five years since stent. Doing ok after being switched back to aspirin. All questions were answered.   Radial approach would be preferred.     2. Mixed hyperlipidemia  Continue Crestor Tablet, 40 mg, 1 tablet, Orally, Once a day Continue Fish Oil Capsule, 1200 MG, 2 capsules, Orally, twice a day TG better on last check. LDL controlled , 84 in 3/13. LDL 96; TG 162 in 3/14.  Followed by Dr. Marisue Humble.  Was high at last checked.  He was not taking Crestor as scheduled due to cost. To be rechecked in 3/16.   3. Essential hypertension, benign   continue Losartan Potassium Tablet, 100 MG, 1 tablet, Orally, Once a day, 30 day(s), 30 Target systolic BP < 585/27. cough resolved.  Controlled today.   4. Obesity, unspecified  COntinue to try to lose weight with diet and exericise. I would recommend that he tries to exercise. He has lost weight since his last visit, although he is unsure why he has lost weight.  He does a lot of work around [employer] and hunts.    Preventive Medicine  Adult topics discussed:  Diet: weight loss, healthy diet.  Exercise: 5 days a week, at least 30 minutes of aerobic exercise.      Signed, Mina Marble, MD, [facility] 03/11/2014 10:38 AM

## 2014-03-11 NOTE — Patient Instructions (Signed)
Your physician recommends that you continue on your current medications as directed. Please refer to the Current Medication list given to you today.    PLEASE CALL OUR BILLING/PRE-CERT DEPARTMENT AT [phone] AND ASK TO SPEAK WITH CHARMAINE HALL IN REGARDS TO THE COST OF YOUR CATHETERIZATION.

## 2014-03-17 ENCOUNTER — Other Ambulatory Visit: Payer: Self-pay | Admitting: *Deleted

## 2014-03-17 DIAGNOSIS — D72819 Decreased white blood cell count, unspecified: Secondary | ICD-10-CM

## 2014-03-17 DIAGNOSIS — D696 Thrombocytopenia, unspecified: Secondary | ICD-10-CM

## 2014-03-17 MED ORDER — LOSARTAN POTASSIUM 100 MG PO TABS: 100.0000 mg | ORAL_TABLET | Freq: Every day | ORAL | Status: DC

## 2014-04-27 ENCOUNTER — Other Ambulatory Visit: Payer: Self-pay | Admitting: Family Medicine

## 2014-04-27 ENCOUNTER — Ambulatory Visit
Admission: RE | Admit: 2014-04-27 | Discharge: 2014-04-27 | Disposition: A | Discharge status: Home / Self Care | Payer: Medicare Other | Source: Ambulatory Visit | Attending: Family Medicine | Admitting: Family Medicine

## 2014-04-27 DIAGNOSIS — G8929 Other chronic pain: Secondary | ICD-10-CM

## 2014-04-27 DIAGNOSIS — R109 Unspecified abdominal pain: Principal | ICD-10-CM

## 2014-07-15 ENCOUNTER — Encounter: Payer: Self-pay | Admitting: Interventional Cardiology

## 2015-06-23 ENCOUNTER — Other Ambulatory Visit: Payer: Self-pay | Admitting: Interventional Cardiology

## 2015-08-30 ENCOUNTER — Other Ambulatory Visit: Payer: Self-pay | Admitting: Interventional Cardiology

## 2015-09-29 NOTE — Progress Notes (Signed)
Cardiology Office Note:    Date:  09/30/2015   ID:  EFE BOURLAND, DOB July 12, 1945, MRN [id number]  PCP:  Donnie Coffin, MD  Cardiologist:  Dr. Casandra Doffing   Electrophysiologist:  n/a  Referring MD: Gaynelle Arabian, MD   Chief Complaint  Patient presents with  . Follow-up    CAD, HTN, HL    History of Present Illness:     Randy Macdonald is a 70 y.o. male with a hx of CAD s/p DES to LAD in 2010, HTN, HL.  Last seen by Dr. Casandra Doffing in 12/15.  He complained of throat tightness with exertion.  Cardiac cath was considered but never done.    He returns for FU.  Here alone.  He is doing well.  He has occ throat pain with swallowing and with activity. However, he can rest and resume his activity without symptoms.  He denies chest pain, syncope, dyspnea, orthopnea, PND, edema.  He does not smoke.  He states he feels the best he has felt in quite some time.    Past Medical History  Diagnosis Date  . Leukopenia 09/01/2011  . Thrombocytopenia ([place]) 09/01/2011  . Macrocytosis without anemia 09/01/2011  . Hyperlipidemia   . CAD (coronary artery disease)     a. LHC 10/10: pLAD 80, dLAD 70, oD1 50, D2 70, EF 55% >> PCI: 2.5 x 18 mm Endeavor DES to mLAD and 3 x 12 mm Endeavor DES to pLAD   . Hypothyroidism   . Diabetes ([place])   . GERD (gastroesophageal reflux disease)   . HTN (hypertension)   . Osteoarthritis     No past surgical history on file.  Current Medications: Outpatient Prescriptions Prior to Visit  Medication Sig Dispense Refill  . aspirin 81 MG tablet Take 81 mg by mouth daily.    . citalopram (CELEXA) 40 MG tablet 40 mg daily.     . CRESTOR 40 MG tablet 40 mg daily.     . diclofenac (VOLTAREN) 75 MG EC tablet Take 75 mg by mouth daily.    . fish oil-omega-3 fatty acids 1000 MG capsule 1,200 mg daily.    . fluorouracil (EFUDEX) 5 % cream Apply topically as needed (Rash----Per patient).     Marland Kitchen levothyroxine (SYNTHROID, LEVOTHROID) 75 MCG tablet Take 75 mcg by mouth daily.    Marland Kitchen  NOVOLIN N RELION 100 UNIT/ML injection Inject 75 Units into the skin daily before breakfast.     . NOVOLIN R RELION 100 UNIT/ML injection Inject 60 Units into the skin 3 (three) times daily before meals.     Marland Kitchen losartan (COZAAR) 100 MG tablet Take 1 tablet (100 mg total) by mouth daily. Patient is overdue for an appointment. Please call and schedule for further refills 15 tablet 0  . metFORMIN (GLUCOPHAGE-XR) 500 MG 24 hr tablet Take 500 mg by mouth 2 (two) times daily. Reported on 09/30/2015     No facility-administered medications prior to visit.      Allergies:   Codeine and Sulfa antibiotics   Social History   Social History  . Marital Status: Married    Spouse Name: N/A  . Number of Children: N/A  . Years of Education: N/A   Social History Main Topics  . Smoking status: Never Smoker   . Smokeless tobacco: None  . Alcohol Use: None  . Drug Use: None  . Sexual Activity: Not Asked   Other Topics Concern  . None   Social History Narrative  Family History:  The patient's family history includes CAD in his brother; Thyroid disease in his sister.   ROS:   Please see the history of present illness.    Review of Systems  Musculoskeletal: Positive for myalgias.  Gastrointestinal: Positive for dysphagia.   All other systems reviewed and are negative.   Physical Exam:    VS:  BP 134/60 mmHg  Pulse 77  Ht 5\' 7"  (1.702 m)  Wt 230 lb (104.327 kg)  BMI 36.01 kg/m2   Physical Exam  Constitutional: He is oriented to person, place, and time. He appears well-developed and well-nourished.  HENT:  Head: Normocephalic and atraumatic.  Neck: Neck supple. No JVD present. Carotid bruit is not present.  Cardiovascular: Normal rate, regular rhythm, S1 normal and S2 normal.   No murmur heard. Pulmonary/Chest: Effort normal and breath sounds normal. He has no wheezes. He has no rales.  Abdominal: Soft. There is no tenderness.  Musculoskeletal: Normal range of motion. He exhibits no  edema.  Neurological: He is alert and oriented to person, place, and time.  Skin: Skin is warm and dry.  Psychiatric: He has a normal mood and affect.    Wt Readings from Last 3 Encounters:  09/30/15 230 lb (104.327 kg)  03/11/14 239 lb 12.8 oz (108.773 kg)  01/28/13 243 lb 12.8 oz (110.587 kg)      Studies/Labs Reviewed:     EKG:  EKG is  ordered today.  The ekg ordered today demonstrates NSR, HR 77, normal axis, QTc 445 ms, no changes  Recent Labs: No results found for requested labs within last 365 days.   Recent Lipid Panel No results found for: CHOL, TRIG, HDL, CHOLHDL, VLDL, LDLCALC, LDLDIRECT  Additional studies/ records that were reviewed today include:   LHC 10/10 LM ok LCx ok LAD prox 80%, dist 70%; oD1 50%, D2 70% EF 55% PCI: 2.5 x 18 mm Endeavor DES to mLAD and 3 x 12 mm Endeavor DES to pLAD   ASSESSMENT:     1. Coronary artery disease involving native coronary artery of native heart without angina pectoris   2. Essential hypertension, benign   3. Hyperlipidemia   4. 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 CAD - Overall, he is doing well.  No clear anginal symptoms.  He has occ throat pain with activity as well as dysphagia.  This is unchanged x years.  His ECG is unchanged.  He is not interested in doing a FU ETT at this time.  Continue ASA, statin.  I have asked him to try to limit his use of NSAIDs.  2. HTN - BP controlled on current regimen.  Refill Losartan.  Creatinine in 3/17 was 0.8.  3. HL - LDL in 3/17 was 127.  Lipids are managed by PCP.  Patient notes some non-compliance with Rosuvastatin.  He is now taking regularly.  Goal LDL is < 70.  FU with PCP.  4. DM - A1c in 1/17 was 10.  FU with PCP for strict control.     Medication Adjustments/Labs and Tests Ordered: Current medicines are reviewed at length with the patient today.  Concerns regarding medicines are  outlined above.  Medication changes, Labs and Tests ordered today are outlined in the Patient Instructions noted below. Patient Instructions  Medication Instructions:  No changes. I sent a refill for Losartan to your pharmacy, Vladimir Faster. Labwork:  None today Testing/Procedures: None  Follow-Up: Dr. Casandra Doffing in 1 year. Any Other Special Instructions Will Be Listed Below (If Applicable). Try to take Tylenol (Acetaminophen) 500 to 1000 mg 3 to 4 times a day (do not take more than 4000 mg in one day) to treat your pain. You should try to avoid taking Diclofenac on a daily basis as it can be hard on your heart, kidneys and stomach. It is ok if you have to take Diclofenac once in a while for severe pain. f you need a refill on your cardiac medications before your next appointment, please call your pharmacy.    Signed, Richardson Dopp, PA-C  09/30/2015 4:02 PM    [facility] [place], [place], [place]  [address] Phone: [phone]; Fax: [phone]

## 2015-09-30 ENCOUNTER — Ambulatory Visit (INDEPENDENT_AMBULATORY_CARE_PROVIDER_SITE_OTHER): Payer: Medicare Other | Admitting: Physician Assistant

## 2015-09-30 ENCOUNTER — Encounter: Payer: Self-pay | Admitting: Physician Assistant

## 2015-09-30 VITALS — BP 134/60 | HR 77 | Ht 67.0 in | Wt 230.0 lb

## 2015-09-30 DIAGNOSIS — E785 Hyperlipidemia, unspecified: Secondary | ICD-10-CM

## 2015-09-30 DIAGNOSIS — E1159 Type 2 diabetes mellitus with other circulatory complications: Secondary | ICD-10-CM

## 2015-09-30 DIAGNOSIS — I10 Essential (primary) hypertension: Secondary | ICD-10-CM

## 2015-09-30 DIAGNOSIS — I2510 Atherosclerotic heart disease of native coronary artery without angina pectoris: Secondary | ICD-10-CM

## 2015-09-30 MED ORDER — LOSARTAN POTASSIUM 100 MG PO TABS: 100.0000 mg | ORAL_TABLET | Freq: Every day | ORAL | Status: AC

## 2015-09-30 NOTE — Patient Instructions (Addendum)
Medication Instructions:  No changes. I sent a refill for Losartan to your pharmacy, Randy Macdonald. Labwork: None today Testing/Procedures: None  Follow-Up: Dr. Casandra Doffing in 1 year. Any Other Special Instructions Will Be Listed Below (If Applicable). Try to take Tylenol (Acetaminophen) 500 to 1000 mg 3 to 4 times a day (do not take more than 4000 mg in one day) to treat your pain. You should try to avoid taking Diclofenac on a daily basis as it can be hard on your heart, kidneys and stomach. It is ok if you have to take Diclofenac once in a while for severe pain. f you need a refill on your cardiac medications before your next appointment, please call your pharmacy.

## 2015-12-22 ENCOUNTER — Emergency Department (HOSPITAL_COMMUNITY): Payer: Medicare Other

## 2015-12-22 ENCOUNTER — Encounter (HOSPITAL_COMMUNITY): Payer: Self-pay | Admitting: *Deleted

## 2015-12-22 ENCOUNTER — Observation Stay (HOSPITAL_COMMUNITY)
Admission: EM | Admit: 2015-12-22 | Discharge: 2015-12-23 | Disposition: A | Discharge status: Home / Self Care | Payer: Medicare Other | Attending: Family Medicine | Admitting: Family Medicine

## 2015-12-22 DIAGNOSIS — D696 Thrombocytopenia, unspecified: Secondary | ICD-10-CM | POA: Diagnosis present

## 2015-12-22 DIAGNOSIS — R5383 Other fatigue: Secondary | ICD-10-CM | POA: Diagnosis present

## 2015-12-22 DIAGNOSIS — I2510 Atherosclerotic heart disease of native coronary artery without angina pectoris: Secondary | ICD-10-CM | POA: Insufficient documentation

## 2015-12-22 DIAGNOSIS — K7689 Other specified diseases of liver: Secondary | ICD-10-CM | POA: Insufficient documentation

## 2015-12-22 DIAGNOSIS — I10 Essential (primary) hypertension: Secondary | ICD-10-CM | POA: Diagnosis not present

## 2015-12-22 DIAGNOSIS — J90 Pleural effusion, not elsewhere classified: Principal | ICD-10-CM

## 2015-12-22 DIAGNOSIS — E785 Hyperlipidemia, unspecified: Secondary | ICD-10-CM | POA: Diagnosis present

## 2015-12-22 DIAGNOSIS — Z7982 Long term (current) use of aspirin: Secondary | ICD-10-CM | POA: Insufficient documentation

## 2015-12-22 DIAGNOSIS — K769 Liver disease, unspecified: Secondary | ICD-10-CM

## 2015-12-22 DIAGNOSIS — E039 Hypothyroidism, unspecified: Secondary | ICD-10-CM | POA: Diagnosis not present

## 2015-12-22 DIAGNOSIS — J948 Other specified pleural conditions: Secondary | ICD-10-CM | POA: Diagnosis not present

## 2015-12-22 DIAGNOSIS — Z9889 Other specified postprocedural states: Secondary | ICD-10-CM

## 2015-12-22 DIAGNOSIS — E1159 Type 2 diabetes mellitus with other circulatory complications: Secondary | ICD-10-CM | POA: Diagnosis not present

## 2015-12-22 HISTORY — DX: Unspecified osteoarthritis, unspecified site: M19.90

## 2015-12-22 HISTORY — DX: Type 2 diabetes mellitus without complications: E11.9

## 2015-12-22 HISTORY — DX: Anemia, unspecified: D64.9

## 2015-12-22 HISTORY — DX: Depression, unspecified: F32.A

## 2015-12-22 HISTORY — DX: Major depressive disorder, single episode, unspecified: F32.9

## 2015-12-22 HISTORY — DX: Unspecified malignant neoplasm of skin, unspecified: C44.90

## 2015-12-22 HISTORY — DX: Pleural effusion, not elsewhere classified: J90

## 2015-12-22 LAB — COMPREHENSIVE METABOLIC PANEL
ALBUMIN: 2.7 g/dL — AB (ref 3.5–5.0)
ALK PHOS: 113 U/L (ref 38–126)
ALT: 34 U/L (ref 17–63)
ANION GAP: 7 (ref 5–15)
AST: 37 U/L (ref 15–41)
BUN: 14 mg/dL (ref 6–20)
CALCIUM: 8.9 mg/dL (ref 8.9–10.3)
CO2: 24 mmol/L (ref 22–32)
Chloride: 103 mmol/L (ref 101–111)
Creatinine, Ser: 0.84 mg/dL (ref 0.61–1.24)
GFR calc non Af Amer: >60 mL/min (ref >60)
GLUCOSE: 265 mg/dL — AB (ref 65–99)
Potassium: 3.6 mmol/L (ref 3.5–5.1)
SODIUM: 134 mmol/L — AB (ref 135–145)
Total Bilirubin: 1.6 mg/dL — ABNORMAL HIGH (ref 0.3–1.2)
Total Protein: 6.5 g/dL (ref 6.5–8.1)

## 2015-12-22 LAB — CBC WITH DIFFERENTIAL/PLATELET
BASOS PCT: 1 %
Basophils Absolute: 0 10*3/uL (ref 0.0–0.1)
EOS ABS: 0 10*3/uL (ref 0.0–0.7)
EOS PCT: 1 %
HCT: 34.7 % — ABNORMAL LOW (ref 39.0–52.0)
HEMOGLOBIN: 11.4 g/dL — AB (ref 13.0–17.0)
Lymphocytes Relative: 13 %
Lymphs Abs: 0.6 10*3/uL — ABNORMAL LOW (ref 0.7–4.0)
MCH: 28.6 pg (ref 26.0–34.0)
MCHC: 32.9 g/dL (ref 30.0–36.0)
MCV: 87.2 fL (ref 78.0–100.0)
Monocytes Absolute: 0.5 10*3/uL (ref 0.1–1.0)
Monocytes Relative: 10 %
Neutro Abs: 3.3 10*3/uL (ref 1.7–7.7)
Neutrophils Relative %: 75 %
PLATELETS: 74 10*3/uL — AB (ref 150–400)
RBC: 3.98 MIL/uL — ABNORMAL LOW (ref 4.22–5.81)
RDW: 14.6 % (ref 11.5–15.5)
WBC: 4.4 10*3/uL (ref 4.0–10.5)

## 2015-12-22 LAB — CK: Total CK: 54 U/L (ref 49–397)

## 2015-12-22 LAB — URINALYSIS, ROUTINE W REFLEX MICROSCOPIC
Bilirubin Urine: NEGATIVE
GLUCOSE, UA: 500 mg/dL — AB
Hgb urine dipstick: NEGATIVE
KETONES UR: NEGATIVE mg/dL
LEUKOCYTES UA: NEGATIVE
NITRITE: NEGATIVE
PH: 6 (ref 5.0–8.0)
Protein, ur: NEGATIVE mg/dL
SPECIFIC GRAVITY, URINE: 1.025 (ref 1.005–1.030)

## 2015-12-22 LAB — GLUCOSE, CAPILLARY
GLUCOSE-CAPILLARY: 255 mg/dL — AB (ref 65–99)
GLUCOSE-CAPILLARY: 305 mg/dL — AB (ref 65–99)

## 2015-12-22 LAB — I-STAT TROPONIN, ED: Troponin i, poc: 0 ng/mL (ref 0.00–0.08)

## 2015-12-22 LAB — PROTIME-INR
INR: 1.27
Prothrombin Time: 16 seconds — ABNORMAL HIGH (ref 11.4–15.2)

## 2015-12-22 LAB — APTT: APTT: 24 s (ref 24–36)

## 2015-12-22 LAB — I-STAT CG4 LACTIC ACID, ED: Lactic Acid, Venous: 1.81 mmol/L (ref 0.5–1.9)

## 2015-12-22 LAB — TSH: TSH: 1.566 u[IU]/mL (ref 0.350–4.500)

## 2015-12-22 MED ORDER — ASPIRIN EC 81 MG PO TBEC
81.0000 mg | DELAYED_RELEASE_TABLET | Freq: Every day | ORAL | Status: DC
  Administered 2015-12-22 – 2015-12-23 (×2): 81 mg via ORAL
  Filled 2015-12-22 (×2): qty 1

## 2015-12-22 MED ORDER — OMEGA-3-ACID ETHYL ESTERS 1 G PO CAPS
1.0000 g | ORAL_CAPSULE | Freq: Every day | ORAL | Status: DC
  Administered 2015-12-22 – 2015-12-23 (×2): 1 g via ORAL
  Filled 2015-12-22 (×2): qty 1

## 2015-12-22 MED ORDER — ONDANSETRON HCL 4 MG/2ML IJ SOLN: 4.0000 mg | Freq: Four times a day (QID) | INTRAMUSCULAR | Status: DC | PRN

## 2015-12-22 MED ORDER — INSULIN ASPART 100 UNIT/ML ~~LOC~~ SOLN
0.0000 [IU] | Freq: Three times a day (TID) | SUBCUTANEOUS | Status: DC
  Administered 2015-12-23 (×2): 5 [IU] via SUBCUTANEOUS
  Administered 2015-12-23: 8 [IU] via SUBCUTANEOUS

## 2015-12-22 MED ORDER — INSULIN ASPART 100 UNIT/ML ~~LOC~~ SOLN
0.0000 [IU] | Freq: Every day | SUBCUTANEOUS | Status: DC
Start: 2015-12-22 — End: 2015-12-23
  Administered 2015-12-22: 4 [IU] via SUBCUTANEOUS

## 2015-12-22 MED ORDER — OMEGA-3 FATTY ACIDS 1000 MG PO CAPS: 1.0000 g | ORAL_CAPSULE | Freq: Every day | ORAL | Status: DC

## 2015-12-22 MED ORDER — LOSARTAN POTASSIUM 50 MG PO TABS
100.0000 mg | ORAL_TABLET | Freq: Every day | ORAL | Status: DC
  Administered 2015-12-22 – 2015-12-23 (×2): 100 mg via ORAL
  Filled 2015-12-22 (×2): qty 2

## 2015-12-22 MED ORDER — ACETAMINOPHEN 650 MG RE SUPP
650.0000 mg | Freq: Four times a day (QID) | RECTAL | Status: DC | PRN
Start: 2015-12-22 — End: 2015-12-23

## 2015-12-22 MED ORDER — ROSUVASTATIN CALCIUM 20 MG PO TABS
40.0000 mg | ORAL_TABLET | Freq: Every day | ORAL | Status: DC
  Administered 2015-12-22 – 2015-12-23 (×2): 40 mg via ORAL
  Filled 2015-12-22 (×2): qty 2

## 2015-12-22 MED ORDER — ONDANSETRON HCL 4 MG PO TABS: 4.0000 mg | ORAL_TABLET | Freq: Four times a day (QID) | ORAL | Status: DC | PRN

## 2015-12-22 MED ORDER — CITALOPRAM HYDROBROMIDE 20 MG PO TABS
20.0000 mg | ORAL_TABLET | Freq: Every day | ORAL | Status: DC
  Administered 2015-12-22 – 2015-12-23 (×2): 20 mg via ORAL
  Filled 2015-12-22 (×2): qty 1

## 2015-12-22 MED ORDER — INSULIN NPH (HUMAN) (ISOPHANE) 100 UNIT/ML ~~LOC~~ SUSP
30.0000 [IU] | Freq: Two times a day (BID) | SUBCUTANEOUS | Status: DC
  Administered 2015-12-23 (×2): 30 [IU] via SUBCUTANEOUS
  Filled 2015-12-22: qty 10

## 2015-12-22 MED ORDER — LEVOTHYROXINE SODIUM 75 MCG PO TABS
75.0000 ug | ORAL_TABLET | Freq: Every day | ORAL | Status: DC
  Administered 2015-12-23: 75 ug via ORAL
  Filled 2015-12-22: qty 1

## 2015-12-22 MED ORDER — IOPAMIDOL (ISOVUE-300) INJECTION 61%
INTRAVENOUS | Status: AC
  Administered 2015-12-22: 75 mL
  Filled 2015-12-22: qty 75

## 2015-12-22 MED ORDER — SODIUM CHLORIDE 0.9% FLUSH
3.0000 mL | Freq: Two times a day (BID) | INTRAVENOUS | Status: DC
  Administered 2015-12-22 – 2015-12-23 (×2): 3 mL via INTRAVENOUS

## 2015-12-22 MED ORDER — ACETAMINOPHEN 325 MG PO TABS: 650.0000 mg | ORAL_TABLET | Freq: Four times a day (QID) | ORAL | Status: DC | PRN

## 2015-12-22 NOTE — ED Notes (Signed)
Pt pulse ox while ambulating was 96% on RA, pt HR 93. Pt ambulated without difficulty but at the end of the walk state he felt short of breath and pt breathing appeared to be more labored.

## 2015-12-22 NOTE — H&P (Signed)
History and Physical  Randy Macdonald [id number] DOB: October 07, 1945 DOA: 12/22/2015   PCP: Donnie Coffin, MD     Patient coming from: Home  Chief Complaint: Fatigue, generalized weakness  HPI:  Randy Macdonald is a 70 y.o. male with medical history of diabetes mellitus, hypothyroidism, hyperlipidemia, coronary artery disease with history of stents to the LAD in 2010 presented with 1 month history of generalized weakness, fatigue, and some dyspnea on exertion. The patient also has been complaining of throat tightness with exertion. According to the patient's cardiology follow-up on 09/29/2015, the patient has been having this symptom for many years, and it has been unchanged. At that time, the patient was not interested in doing a follow-up exercise tolerance test. He has had subjective fevers and chills without any headache, coughing, hemoptysis, nausea, vomiting, diarrhea, dysuria, hematuria. He denies any hematochezia or melena. He denies any orthopnea, PND, lower extremity edema. He has not started on any recent medications.  Workup in the emergency department revealed platelets of 74,000, hemoglobin 11.4, WBC 4.4. Sodium was 134 with creatinine 0.4. Urinalysis was negative for pyuria but showed glucosuria. Lactic acid was 1.1. EKG shows sinus rhythm with nonspecific T-wave changes. CT of the chest revealed a moderate right-sided effusion with associated right lower lobe atelectasis. There was no masses. The spleen was prominent.  Assessment/Plan: Fatigue/generalized weakness/dyspnea on exertion -Suspect this is likely due to the patient's right pleural effusion -Request IR thoracocentesis--> send for cell count, protein, LDH, cytology -Check TSH -A.m. Cortisol -Urinalysis negative for pyuria -Patient also has had 3 g drop in hemoglobin in the past 3 years--previous baseline hemoglobin 14--> check iron studies, B12, RBC folate  Right pleural effusion -Request IR for  thoracocentesis -Echocardiogram -Urine protein creatinine ratio -Check LFTs  Thrombocytopenia -This has been chronic dating back to 2010 -Serum B12, RBC folate -May be related to underlying liver disease as the patient has prominent spleen -Hepatitis B surface antigen -Hepatitis C antibody -Check coags  Chest discomfort -EKG without concerning ischemic changes -Cycle troponins  Diabetes mellitus type 2 -Hemoglobin A1c -Continue reduced dose Humulin N -NovoLog sliding scale -Hold metformin and Januvia  Coronary artery disease with history of stent -Continue aspirin and Crestor  Hypothyroidism -Continue Synthroid -Check TSH  Hypertension -Continue losartan        Past Medical History:  Diagnosis Date  . CAD (coronary artery disease)    a. LHC 10/10: pLAD 80, dLAD 70, oD1 50, D2 70, EF 55% >> PCI: 2.5 x 18 mm Endeavor DES to mLAD and 3 x 12 mm Endeavor DES to pLAD   . Diabetes ([place])   . GERD (gastroesophageal reflux disease)   . HTN (hypertension)   . Hyperlipidemia   . Hypothyroidism   . Leukopenia 09/01/2011  . Macrocytosis without anemia 09/01/2011  . Osteoarthritis   . Thrombocytopenia ([place]) 09/01/2011   History reviewed. No pertinent surgical history. Social History:  reports that he has never smoked. He has never used smokeless tobacco. He reports that he does not drink alcohol or use drugs.   Family History  Problem Relation Age of Onset  . Thyroid disease Sister   . CAD Brother      Allergies  Allergen Reactions  . Codeine Nausea And Vomiting  . Sulfa Antibiotics Nausea And Vomiting     Prior to Admission medications   Medication Sig Start Date End Date Taking? Authorizing Provider  fluorouracil (EFUDEX) 5 % cream Apply topically as needed (Rash----Per patient).  01/27/14  Yes Historical Provider, MD  JANUMET XR [phone] MG TB24 Take 100-1,000 mg by mouth 2 (two) times daily. 06/24/15  Yes Historical Provider, MD  losartan (COZAAR) 100 MG  tablet Take 1 tablet (100 mg total) by mouth daily. 09/30/15  Yes Liliane Shi, PA-C  aspirin 81 MG tablet Take 81 mg by mouth daily.    Historical Provider, MD  citalopram (CELEXA) 40 MG tablet 40 mg daily.  06/02/11   Historical Provider, MD  CRESTOR 40 MG tablet 40 mg daily.  07/21/11   Historical Provider, MD  diclofenac (VOLTAREN) 75 MG EC tablet Take 75 mg by mouth daily.    Historical Provider, MD  fish oil-omega-3 fatty acids 1000 MG capsule 1,200 mg daily.    Historical Provider, MD  levothyroxine (SYNTHROID, LEVOTHROID) 75 MCG tablet Take 75 mcg by mouth daily.    Historical Provider, MD  NOVOLIN N RELION 100 UNIT/ML injection Inject 75 Units into the skin daily before breakfast.  08/07/11   Historical Provider, MD  NOVOLIN R RELION 100 UNIT/ML injection Inject 50 Units into the skin 3 (three) times daily before meals.  08/06/11   Historical Provider, MD    Review of Systems:  Constitutional:  No weight loss, night sweats,  Head&Eyes: No headache.  No vision loss.  No eye pain or scotoma ENT:  No Difficulty swallowing,Tooth/dental problems,Sore throat,  No ear ache, post nasal drip,  Cardio-vascular:  No  Orthopnea, PND, swelling in lower extremities,  dizziness, palpitations  GI:  No  abdominal pain, nausea, vomiting, diarrhea, loss of appetite, hematochezia, melena, heartburn, indigestion, Resp:   No cough. No coughing up of blood .No wheezing.No chest wall deformity  Skin:  no rash or lesions.  GU:  no dysuria, change in color of urine, no urgency or frequency. No flank pain.  Musculoskeletal:  No joint pain or swelling. No decreased range of motion. No back pain.  Psych:  No change in mood or affect. No depression or anxiety. Neurologic: No headache, no dysesthesia, no focal weakness, no vision loss. No syncope  Physical Exam: Vitals:   12/22/15 1450 12/22/15 1515 12/22/15 1545 12/22/15 1645  BP: 142/64 150/67 147/69 147/64  Pulse: 76 74 72 74  Resp: 20 20 24 22    Temp:      TempSrc:      SpO2: 94% 94% 94% 93%   General:  A&O x 3, NAD, nontoxic, pleasant/cooperative Head/Eye: No conjunctival hemorrhage, no icterus, [place]/AT, No nystagmus ENT:  No icterus,  No thrush, good dentition, no pharyngeal exudate Neck:  No masses, no lymphadenpathy, no bruits CV:  RRR, no rub, no gallop, no S3 Lung:  Diminished breath sounds right base. Bibasilar crackles. No wheezing. Abdomen: soft/NT, +BS, nondistended, no peritoneal signs Ext: No cyanosis, No rashes, No petechiae, No lymphangitis, No edema Neuro: CNII-XII intact, strength 4/5 in bilateral upper and lower extremities, no dysmetria  Labs on Admission:  Basic Metabolic Panel:  Recent Labs Lab 12/22/15 1103  NA 134*  K 3.6  CL 103  CO2 24  GLUCOSE 265*  BUN 14  CREATININE 0.84  CALCIUM 8.9   Liver Function Tests:  Recent Labs Lab 12/22/15 1103  AST 37  ALT 34  ALKPHOS 113  BILITOT 1.6*  PROT 6.5  ALBUMIN 2.7*   No results for input(s): LIPASE, AMYLASE in the last 168 hours. No results for input(s): AMMONIA in the last 168 hours. CBC:  Recent Labs Lab 12/22/15 1103  WBC 4.4  NEUTROABS 3.3  HGB 11.4*  HCT 34.7*  MCV 87.2  PLT 74*   Coagulation Profile: No results for input(s): INR, PROTIME in the last 168 hours. Cardiac Enzymes:  Recent Labs Lab 12/22/15 1103  CKTOTAL 54   BNP: Invalid input(s): POCBNP CBG: No results for input(s): GLUCAP in the last 168 hours. Urine analysis:    Component Value Date/Time   COLORURINE AMBER (A) 12/22/2015 1445   APPEARANCEUR CLEAR 12/22/2015 1445   LABSPEC 1.025 12/22/2015 1445   PHURINE 6.0 12/22/2015 1445   GLUCOSEU 500 (A) 12/22/2015 1445   HGBUR NEGATIVE 12/22/2015 1445   BILIRUBINUR NEGATIVE 12/22/2015 1445   KETONESUR NEGATIVE 12/22/2015 1445   PROTEINUR NEGATIVE 12/22/2015 1445   NITRITE NEGATIVE 12/22/2015 1445   LEUKOCYTESUR NEGATIVE 12/22/2015 1445   Sepsis Labs: @LABRCNTIP (procalcitonin:4,lacticidven:4) )No  results found for this or any previous visit (from the past 240 hour(s)).   Radiological Exams on Admission: Dg Chest 2 View  Result Date: 12/22/2015 CLINICAL DATA:  Fever chills and weakness for 3 weeks. EXAM: CHEST  2 VIEW COMPARISON:  None. FINDINGS: PA and lateral views of the chest. Mild streaky linear atelectasis in the medial left lung base. Left lung otherwise clear. Small to moderate right-sided pleural effusion. Consolidative process present in the right middle lobe and right lung base. Cardiomediastinal silhouette partially obscured. No pneumothorax. Atherosclerotic vascular calcification of the aorta. Degenerative changes of the spine. IMPRESSION: 1. Small to moderate right-sided pleural effusion with right middle lobe and right lung base consolidations. 2. Streaky left lung base atelectasis. Electronically Signed   By: Donavan Foil M.D.   On: 12/22/2015 12:27   Ct Chest W Contrast  Result Date: 12/22/2015 CLINICAL DATA:  Short of breath, cough, fever for 3 weeks EXAM: CT CHEST WITH CONTRAST TECHNIQUE: Multidetector CT imaging of the chest was performed during intravenous contrast administration. CONTRAST:  5mL ISOVUE-300 IOPAMIDOL (ISOVUE-300) INJECTION 61% COMPARISON:  Chest x-ray of 12/23/2005 FINDINGS: Cardiovascular: The heart is mildly enlarged. No pericardial effusion is seen. Coronary artery calcifications are present. The mid ascending thoracic aorta measures 3.6 cm. The pulmonary arteries are unremarkable. Moderate thoracic aortic atherosclerosis is present. Mediastinum/Nodes: No mediastinal or hilar adenopathy is seen. Lungs/Pleura: There is a moderate large right pleural effusion present. There is resultant partial atelectasis of the right lower lobe with mild linear atelectasis in the right middle lobe as well. The left lung is clear. No suspicious lung nodule or mass is seen. A calcified granuloma is present within the right upper lobe. Upper Abdomen: There is abnormal  low-attenuation lesion within much of the left lobe of liver anteriorly. This could represent and unusual hepatic hemangioma, but hepatocellular carcinoma is a definite consideration and MRI of the abdomen is recommended to evaluate further. No intrahepatic ductal dilatation is seen. No calcified gallstones are noted. Incidental 4 mm nonobstructing right upper pole calculus is present. The spleen is slightly prominent. Musculoskeletal: The thoracic vertebrae are in normal alignment with diffuse degenerative spurring present. IMPRESSION: 1. Moderately large right pleural effusion with partial atelectasis of the adjacent right lower lobe and minimal linear atelectasis in the right middle lobe. 2. Abnormal low-attenuation lesion within the left lobe of liver worrisome for hepatocellular carcinoma. Recommend MRI of the abdomen to assess further. 3. Incidental 4 mm nonobstructing right upper pole renal calculus. 4. Moderate thoracic aortic atherosclerosis with coronary artery calcifications present. Cardiomegaly. Electronically Signed   By: Ivar Drape M.D.   On: 12/22/2015 14:45    EKG: Independently reviewed. Sinus rhythm, nonspecific T-wave change  Time spent:60 minutes Code Status:   FULL Family Communication:  Wife updated at bedside Disposition Plan: expect 1-2day hospitalization Consults called: none DVT Prophylaxis:    SCDs  Mahaley Schwering, DO  [facility] Pager [phone]  If 7PM-7AM, please contact night-coverage [email or web address] Password [email or web address] 12/22/2015, 5:25 PM

## 2015-12-22 NOTE — ED Notes (Signed)
Py ambulated to restroom, pt tolerated well

## 2015-12-22 NOTE — ED Notes (Signed)
Attempted report 

## 2015-12-22 NOTE — ED Triage Notes (Signed)
Pt c/o HA, fever, chills onset x 3-4 wks, denies n/v/d, denies cough, pt A&O x4

## 2015-12-22 NOTE — ED Notes (Signed)
Pt ambulated to bathroom. Pt tolerated well.

## 2015-12-22 NOTE — ED Notes (Signed)
Per nurse cnc Istat lactic acid

## 2015-12-22 NOTE — ED Provider Notes (Signed)
[facility] DEPT Provider Note   CSN: [id number] Arrival date & time: 12/22/15  1048     History   Chief Complaint Chief Complaint  Patient presents with  . Fatigue  . Generalized Body Aches    HPI ADRAIN GIACOMO is a 70 y.o. male.  The history is provided by the patient.  Fever   This is a new problem. The current episode started more than 1 week ago. The problem occurs constantly. The problem has not changed since onset.His temperature was unmeasured prior to arrival. Associated symptoms include muscle aches. Pertinent negatives include no chest pain, no diarrhea, no vomiting, no congestion, no headaches, no sore throat and no cough. He has tried nothing for the symptoms.   70 year old male who presents with fatigue, subjective fever and chills with generalized body aches. He has a history of diabetes, hypertension, hyperlipidemia, coronary artery disease. States that for the past 3-4 weeks he has had progressively worsening fatigue with subjective fevers, chills, sweats, and generalized body aches with myalgias. States that he was recently seen by his primary care doctor for this with blood work showing pancytopenia. He was getting referred to hematologist for further workup, but appointment has not been established yet. He has had minimal mild nonproductive cough. With activity with fatigue and shortness of breath. No chest pain. He denies any abdominal pain, vomiting or diarrhea. He denies any dysuria or urinary frequency. Denies any known rash. Did have tick bite last spring. No melena or hematochezia, last colonscopy 4 years ago w/ hemorrhoids. Occasionally with BRBPR. Recent prostate check by PCP normal.  Past Medical History:  Diagnosis Date  . CAD (coronary artery disease)    a. LHC 10/10: pLAD 80, dLAD 70, oD1 50, D2 70, EF 55% >> PCI: 2.5 x 18 mm Endeavor DES to mLAD and 3 x 12 mm Endeavor DES to pLAD   . Diabetes ([place])   . GERD (gastroesophageal reflux disease)   . HTN  (hypertension)   . Hyperlipidemia   . Hypothyroidism   . Leukopenia 09/01/2011  . Macrocytosis without anemia 09/01/2011  . Osteoarthritis   . Thrombocytopenia ([place]) 09/01/2011    Patient Active Problem List   Diagnosis Date Noted  . Pleural effusion on right 12/22/2015  . Pleural effusion, right 12/22/2015  . Obesity 01/28/2013  . Essential hypertension, benign 01/28/2013  . Hyperlipidemia   . Coronary atherosclerosis of native coronary artery   . Hypothyroidism   . Diabetes ([place])   . GERD (gastroesophageal reflux disease)   . Leukopenia 09/01/2011  . Thrombocytopenia ([place]) 09/01/2011  . Macrocytosis without anemia 09/01/2011    History reviewed. No pertinent surgical history.     Home Medications    Prior to Admission medications   Medication Sig Start Date End Date Taking? Authorizing Provider  fluorouracil (EFUDEX) 5 % cream Apply topically as needed (Rash----Per patient).  01/27/14  Yes Historical Provider, MD  JANUMET XR [id number] MG TB24 Take 100-1,000 mg by mouth 2 (two) times daily. 06/24/15  Yes Historical Provider, MD  losartan (COZAAR) 100 MG tablet Take 1 tablet (100 mg total) by mouth daily. 09/30/15  Yes Liliane Shi, PA-C  aspirin 81 MG tablet Take 81 mg by mouth daily.    Historical Provider, MD  citalopram (CELEXA) 40 MG tablet 40 mg daily.  06/02/11   Historical Provider, MD  CRESTOR 40 MG tablet 40 mg daily.  07/21/11   Historical Provider, MD  diclofenac (VOLTAREN) 75 MG EC tablet Take 75  mg by mouth daily.    Historical Provider, MD  fish oil-omega-3 fatty acids 1000 MG capsule 1,200 mg daily.    Historical Provider, MD  levothyroxine (SYNTHROID, LEVOTHROID) 75 MCG tablet Take 75 mcg by mouth daily.    Historical Provider, MD  NOVOLIN N RELION 100 UNIT/ML injection Inject 75 Units into the skin daily before breakfast.  08/07/11   Historical Provider, MD  NOVOLIN R RELION 100 UNIT/ML injection Inject 50 Units into the skin 3 (three) times daily before meals.   08/06/11   Historical Provider, MD    Family History Family History  Problem Relation Age of Onset  . Thyroid disease Sister   . CAD Brother     Social History Social History  Substance Use Topics  . Smoking status: Never Smoker  . Smokeless tobacco: Never Used  . Alcohol use No     Allergies   Codeine and Sulfa antibiotics   Review of Systems Review of Systems  Constitutional: Positive for fever.  HENT: Negative for congestion and sore throat.   Respiratory: Negative for cough.   Cardiovascular: Negative for chest pain.  Gastrointestinal: Negative for diarrhea and vomiting.  Neurological: Negative for headaches.   10/14 systems reviewed and are negative other than those stated in the HPI   Physical Exam Updated Vital Signs BP 148/64   Pulse 76   Temp 97.9 F (36.6 C) (Oral)   Resp 24   SpO2 96%   Physical Exam Physical Exam  Nursing note and vitals reviewed. Constitutional: Well developed, well nourished, non-toxic, and in no acute distress Head: Normocephalic and atraumatic.  Mouth/Throat: Oropharynx is clear and moist.  Neck: Normal range of motion. Neck supple. Eye: conjunctiva normal  Cardiovascular: Normal rate and regular rhythm.   Pulmonary/Chest: Effort normal and breath sounds diminished over right lung base to mid lung.  Abdominal: Soft. There is no tenderness. There is no rebound and no guarding.  Musculoskeletal: Normal range of motion.  Neurological: Alert, no facial droop, fluent speech, moves all extremities symmetrically Skin: Skin is warm and dry.  Psychiatric: Cooperative   ED Treatments / Results  Labs (all labs ordered are listed, but only abnormal results are displayed) Labs Reviewed  CBC WITH DIFFERENTIAL/PLATELET - Abnormal; Notable for the following:       Result Value   RBC 3.98 (*)    Hemoglobin 11.4 (*)    HCT 34.7 (*)    Platelets 74 (*)    Lymphs Abs 0.6 (*)    All other components within normal limits    COMPREHENSIVE METABOLIC PANEL - Abnormal; Notable for the following:    Sodium 134 (*)    Glucose, Bld 265 (*)    Albumin 2.7 (*)    Total Bilirubin 1.6 (*)    All other components within normal limits  URINALYSIS, ROUTINE W REFLEX MICROSCOPIC (NOT AT [facility]) - Abnormal; Notable for the following:    Color, Urine AMBER (*)    Glucose, UA 500 (*)    All other components within normal limits  CK  I-STAT CG4 LACTIC ACID, ED  I-STAT TROPOININ, ED    EKG  EKG Interpretation  Date/Time:  Wednesday December 22 2015 12:34:46 EDT Ventricular Rate:  81 PR Interval:    QRS Duration: 84 QT Interval:  372 QTC Calculation: 432 R Axis:   9 Text Interpretation:  Sinus rhythm Abnormal R-wave progression, early transition No acute changes Confirmed by Zacharia Sowles MD, Toshio Slusher [id number]) on 12/22/2015 3:07:52 PM  Radiology Dg Chest 2 View  Result Date: 12/22/2015 CLINICAL DATA:  Fever chills and weakness for 3 weeks. EXAM: CHEST  2 VIEW COMPARISON:  None. FINDINGS: PA and lateral views of the chest. Mild streaky linear atelectasis in the medial left lung base. Left lung otherwise clear. Small to moderate right-sided pleural effusion. Consolidative process present in the right middle lobe and right lung base. Cardiomediastinal silhouette partially obscured. No pneumothorax. Atherosclerotic vascular calcification of the aorta. Degenerative changes of the spine. IMPRESSION: 1. Small to moderate right-sided pleural effusion with right middle lobe and right lung base consolidations. 2. Streaky left lung base atelectasis. Electronically Signed   By: Donavan Foil M.D.   On: 12/22/2015 12:27   Ct Chest W Contrast  Result Date: 12/22/2015 CLINICAL DATA:  Short of breath, cough, fever for 3 weeks EXAM: CT CHEST WITH CONTRAST TECHNIQUE: Multidetector CT imaging of the chest was performed during intravenous contrast administration. CONTRAST:  13mL ISOVUE-300 IOPAMIDOL (ISOVUE-300) INJECTION 61% COMPARISON:  Chest x-ray  of 12/23/2005 FINDINGS: Cardiovascular: The heart is mildly enlarged. No pericardial effusion is seen. Coronary artery calcifications are present. The mid ascending thoracic aorta measures 3.6 cm. The pulmonary arteries are unremarkable. Moderate thoracic aortic atherosclerosis is present. Mediastinum/Nodes: No mediastinal or hilar adenopathy is seen. Lungs/Pleura: There is a moderate large right pleural effusion present. There is resultant partial atelectasis of the right lower lobe with mild linear atelectasis in the right middle lobe as well. The left lung is clear. No suspicious lung nodule or mass is seen. A calcified granuloma is present within the right upper lobe. Upper Abdomen: There is abnormal low-attenuation lesion within much of the left lobe of liver anteriorly. This could represent and unusual hepatic hemangioma, but hepatocellular carcinoma is a definite consideration and MRI of the abdomen is recommended to evaluate further. No intrahepatic ductal dilatation is seen. No calcified gallstones are noted. Incidental 4 mm nonobstructing right upper pole calculus is present. The spleen is slightly prominent. Musculoskeletal: The thoracic vertebrae are in normal alignment with diffuse degenerative spurring present. IMPRESSION: 1. Moderately large right pleural effusion with partial atelectasis of the adjacent right lower lobe and minimal linear atelectasis in the right middle lobe. 2. Abnormal low-attenuation lesion within the left lobe of liver worrisome for hepatocellular carcinoma. Recommend MRI of the abdomen to assess further. 3. Incidental 4 mm nonobstructing right upper pole renal calculus. 4. Moderate thoracic aortic atherosclerosis with coronary artery calcifications present. Cardiomegaly. Electronically Signed   By: Ivar Drape M.D.   On: 12/22/2015 14:45    Procedures Procedures (including critical care time)  Medications Ordered in ED Medications  iopamidol (ISOVUE-300) 61 % injection  (75 mLs  Contrast Given 12/22/15 1427)     Initial Impression / Assessment and Plan / ED Course  I have reviewed the triage vital signs and the nursing notes.  Pertinent labs & imaging results that were available during my care of the patient were reviewed by me and considered in my medical decision making (see chart for details).  Clinical Course    With 3-4 weeks subjective fever, chills, night sweats and malaise. No clear source of infection by history. Afebrile in ED, HD stable. Low 90 percent sats in ED. Differential includes infection, atypical infection such as tick borne illness, or underlying malignancy. With anemia and thrombocytopenia, but he has history of this in the past. CXR with new right lung effusion, and on CT w/o associating infection or mass. Also ? Lesion in liver concern for malignancy.  No leukocytosis or leukopenia and with normal lactate. Clean urine. ? Malignancy requiring thoracentesis and cytology. Discussed to hospitalist service who will admit for observation.  Final Clinical Impressions(s) / ED Diagnoses   Final diagnoses:  Pleural effusion on right    New Prescriptions New Prescriptions   No medications on file     Forde Dandy, MD 12/22/15 1801

## 2015-12-23 ENCOUNTER — Observation Stay (HOSPITAL_COMMUNITY): Payer: Medicare Other

## 2015-12-23 ENCOUNTER — Observation Stay (HOSPITAL_BASED_OUTPATIENT_CLINIC_OR_DEPARTMENT_OTHER): Payer: Medicare Other

## 2015-12-23 ENCOUNTER — Other Ambulatory Visit (HOSPITAL_COMMUNITY): Payer: Medicare Other

## 2015-12-23 ENCOUNTER — Encounter (HOSPITAL_COMMUNITY): Payer: Self-pay | Admitting: Student

## 2015-12-23 DIAGNOSIS — I10 Essential (primary) hypertension: Secondary | ICD-10-CM

## 2015-12-23 DIAGNOSIS — K7689 Other specified diseases of liver: Secondary | ICD-10-CM | POA: Diagnosis not present

## 2015-12-23 DIAGNOSIS — J948 Other specified pleural conditions: Secondary | ICD-10-CM

## 2015-12-23 DIAGNOSIS — R079 Chest pain, unspecified: Secondary | ICD-10-CM | POA: Diagnosis not present

## 2015-12-23 DIAGNOSIS — E038 Other specified hypothyroidism: Secondary | ICD-10-CM

## 2015-12-23 DIAGNOSIS — D696 Thrombocytopenia, unspecified: Secondary | ICD-10-CM | POA: Diagnosis not present

## 2015-12-23 DIAGNOSIS — E785 Hyperlipidemia, unspecified: Secondary | ICD-10-CM

## 2015-12-23 LAB — COMPREHENSIVE METABOLIC PANEL
ALBUMIN: 2.4 g/dL — AB (ref 3.5–5.0)
ALK PHOS: 128 U/L — AB (ref 38–126)
ALT: 32 U/L (ref 17–63)
ANION GAP: 7 (ref 5–15)
AST: 37 U/L (ref 15–41)
BUN: 11 mg/dL (ref 6–20)
CALCIUM: 8.5 mg/dL — AB (ref 8.9–10.3)
CO2: 25 mmol/L (ref 22–32)
Chloride: 102 mmol/L (ref 101–111)
Creatinine, Ser: 0.85 mg/dL (ref 0.61–1.24)
GFR calc Af Amer: >60 mL/min (ref >60)
GFR calc non Af Amer: >60 mL/min (ref >60)
GLUCOSE: 276 mg/dL — AB (ref 65–99)
POTASSIUM: 3.7 mmol/L (ref 3.5–5.1)
SODIUM: 134 mmol/L — AB (ref 135–145)
Total Bilirubin: 1 mg/dL (ref 0.3–1.2)
Total Protein: 6 g/dL — ABNORMAL LOW (ref 6.5–8.1)

## 2015-12-23 LAB — PROTEIN / CREATININE RATIO, URINE
CREATININE, URINE: 78.34 mg/dL
Protein Creatinine Ratio: 0.09 mg/mg{Cre} (ref 0.00–0.15)
Total Protein, Urine: 7 mg/dL

## 2015-12-23 LAB — CORTISOL-AM, BLOOD: CORTISOL - AM: 19.2 ug/dL (ref 6.7–22.6)

## 2015-12-23 LAB — MAGNESIUM: Magnesium: 1.5 mg/dL — ABNORMAL LOW (ref 1.7–2.4)

## 2015-12-23 LAB — BODY FLUID CELL COUNT WITH DIFFERENTIAL
Eos, Fluid: 0 %
LYMPHS FL: 11 %
MONOCYTE-MACROPHAGE-SEROUS FLUID: 88 % (ref 50–90)
Neutrophil Count, Fluid: 1 % (ref 0–25)
Total Nucleated Cell Count, Fluid: 743 cu mm (ref 0–1000)

## 2015-12-23 LAB — LACTATE DEHYDROGENASE, PLEURAL OR PERITONEAL FLUID: LD, Fluid: 133 U/L — ABNORMAL HIGH (ref 3–23)

## 2015-12-23 LAB — ECHOCARDIOGRAM COMPLETE
HEIGHTINCHES: 68 in
Weight: 3264 oz

## 2015-12-23 LAB — HEPATIC FUNCTION PANEL
ALBUMIN: 2.6 g/dL — AB (ref 3.5–5.0)
ALT: 34 U/L (ref 17–63)
AST: 39 U/L (ref 15–41)
Alkaline Phosphatase: 131 U/L — ABNORMAL HIGH (ref 38–126)
BILIRUBIN INDIRECT: 0.6 mg/dL (ref 0.3–0.9)
Bilirubin, Direct: 0.2 mg/dL (ref 0.1–0.5)
TOTAL PROTEIN: 6.4 g/dL — AB (ref 6.5–8.1)
Total Bilirubin: 0.8 mg/dL (ref 0.3–1.2)

## 2015-12-23 LAB — HEMOGLOBIN A1C
Hgb A1c MFr Bld: 8.3 % — ABNORMAL HIGH (ref 4.8–5.6)
MEAN PLASMA GLUCOSE: 192 mg/dL

## 2015-12-23 LAB — VITAMIN B12: Vitamin B-12: 415 pg/mL (ref 180–914)

## 2015-12-23 LAB — IRON AND TIBC
Iron: 22 ug/dL — ABNORMAL LOW (ref 45–182)
SATURATION RATIOS: 8 % — AB (ref 17.9–39.5)
TIBC: 272 ug/dL (ref 250–450)
UIBC: 250 ug/dL

## 2015-12-23 LAB — GLUCOSE, CAPILLARY
GLUCOSE-CAPILLARY: 272 mg/dL — AB (ref 65–99)
Glucose-Capillary: 221 mg/dL — ABNORMAL HIGH (ref 65–99)
Glucose-Capillary: 213 mg/dL — ABNORMAL HIGH (ref 65–99)

## 2015-12-23 LAB — LACTATE DEHYDROGENASE: LDH: 273 U/L — AB (ref 98–192)

## 2015-12-23 LAB — TROPONIN I
Troponin I: <0.03 ng/mL (ref <0.03)
Troponin I: <0.03 ng/mL (ref <0.03)

## 2015-12-23 LAB — GRAM STAIN

## 2015-12-23 LAB — FOLATE RBC
FOLATE, RBC: 1181 ng/mL (ref >498)
Folate, Hemolysate: 391 ng/mL
Hematocrit: 33.1 % — ABNORMAL LOW (ref 37.5–51.0)

## 2015-12-23 LAB — PROTEIN, BODY FLUID

## 2015-12-23 LAB — FERRITIN: Ferritin: 155 ng/mL (ref 24–336)

## 2015-12-23 LAB — GLUCOSE, SEROUS FLUID: GLUCOSE FL: 244 mg/dL

## 2015-12-23 MED ORDER — GADOXETATE DISODIUM 0.25 MMOL/ML IV SOLN
10.0000 mL | Freq: Once | INTRAVENOUS | Status: AC | PRN
  Administered 2015-12-23: 10 mL via INTRAVENOUS

## 2015-12-23 MED ORDER — PERFLUTREN LIPID MICROSPHERE
1.0000 mL | INTRAVENOUS | Status: AC | PRN
  Administered 2015-12-23: 2 mL via INTRAVENOUS
  Filled 2015-12-23: qty 10

## 2015-12-23 MED ORDER — LIDOCAINE HCL 1 % IJ SOLN
INTRAMUSCULAR | Status: AC
  Filled 2015-12-23: qty 20

## 2015-12-23 MED ORDER — CITALOPRAM HYDROBROMIDE 20 MG PO TABS: 20.0000 mg | ORAL_TABLET | Freq: Every day | ORAL | 0 refills | Status: AC

## 2015-12-23 NOTE — Progress Notes (Signed)
Patient Discharge: Disposition: Patient discharged to home. Education: Reviewed the medications, prescriptions, follow-up appointments and discharge instructions, understood and acknowledged. IV: Discontinued IV before discharge. Telemetry: Discontinued before discharge, CCMD notified. Transportation: Patient escorted out of the unit by the staff member. Belongings: Patient took all his belongings with him.

## 2015-12-23 NOTE — Progress Notes (Signed)
  Echocardiogram 2D Echocardiogram has been performed.  Jennette Dubin 12/23/2015, 4:17 PM

## 2015-12-23 NOTE — Discharge Instructions (Addendum)
Mr. Randy Macdonald, your admitted because of fluid around your lung. This was taken out during a procedure. The preliminary results show that this fluid is likely related to fluid shifts from your blood vessels to the space between your lungs. We got an x-ray of her lungs which showed some mild enlargement of her spleen and an area of your liver that could not be well identified. Recommendation to further evaluate this includes an MRI of her liver. I discussed with you that this is something that needs to be followed up closely. Cancer is in the differential, but is not definitive. Please make sure to follow-up with her primary care physician so that this finding can be well evaluated. The rest of the labs from your fluid collection are still pending. The heart ultrasound showed that your heart is very healthy structurally. There is a chance that the fluid in the lungs may return and cause you to regain your symptoms of shortness of breath. Please return if this occurs. Also, please return if you have omental fevers, chest pain, some night sweats.

## 2015-12-23 NOTE — Progress Notes (Signed)
Inpatient Diabetes Program Recommendations  AACE/ADA: New Consensus Statement on Inpatient Glycemic Control (2015)  Target Ranges:  Prepandial:   less than 140 mg/dL      Peak postprandial:   less than 180 mg/dL (1-2 hours)      Critically ill patients:  140 - 180 mg/dL   Lab Results  Component Value Date   GLUCAP 272 (H) 12/23/2015   HGBA1C 8.3 (H) 12/22/2015    Review of Glycemic Control  Results for Randy Macdonald, Randy Macdonald (MRN [id number]) as of 12/23/2015 13:36  Ref. Range 12/22/2015 21:13 12/22/2015 23:58 12/23/2015 08:00 12/23/2015 11:49  Glucose-Capillary Latest Ref Range: 65 - 99 mg/dL 305 (H) 255 (H) 221 (H) 272 (H)    Diabetes history: Type 2 Outpatient Diabetes medications: Janumet 100/1000mg  qday, Novolin N 75 units qam, Novolin R  50 units tid Current orders for Inpatient glycemic control: NPH 30 units bid, Novolog 0-5 units qhs, Novolog moderate correction 0-15 units tid  Inpatient Diabetes Program Recommendations: NPH started this am.   Consider increasing Novolog correction to Novolog resistant correction 0-20 units tid while he is NPO.  Consider adding Novolog 5 units tid with meals once the patient begins to eat. Patient should continue to received correction insulin once he's eating but consider ordering the moderate correction scale.   Spoke to RN to give lunch time correction insulin.   Gentry Fitz, RN, BA, MHA, CDE Diabetes Coordinator Inpatient Diabetes Program  [phone] (Team Pager) [phone] ([place]) 12/23/2015 1:44 PM

## 2015-12-23 NOTE — Procedures (Signed)
Ultrasound-guided diagnostic and therapeutic right thoracentesis performed yielding 1.2 liters of slightly hazy, yellow  fluid. No immediate complications. Follow-up chest x-ray pending.The fluid was sent to the lab for preordered studies. The pt did have a mild vasovagal response during procedure which resolved with reverse Trendelenburg positioning and cool compress to forehead.

## 2015-12-23 NOTE — Care Management Obs Status (Signed)
[facility] NOTIFICATION   Patient Details  Name: Randy Macdonald MRN: [id number] Date of Birth: 16-Nov-1945   Medicare Observation Status Notification Given:       Adron Bene, RN 12/23/2015, 4:39 PM

## 2015-12-24 ENCOUNTER — Telehealth: Payer: Self-pay | Admitting: Family Medicine

## 2015-12-24 DIAGNOSIS — K769 Liver disease, unspecified: Secondary | ICD-10-CM

## 2015-12-24 LAB — PATHOLOGIST SMEAR REVIEW: Path Review: REACTIVE

## 2015-12-24 LAB — AFP TUMOR MARKER: AFP-Tumor Marker: 1 ng/mL (ref 0.0–8.3)

## 2015-12-24 LAB — HEPATITIS C ANTIBODY: HCV Ab: <0.1 s/co ratio (ref 0.0–0.9)

## 2015-12-24 LAB — HEPATITIS B SURFACE ANTIGEN
HEP B S AG: NEGATIVE
Hepatitis B Surface Ag: NEGATIVE

## 2015-12-24 NOTE — Discharge Summary (Addendum)
Physician Discharge Summary  Randy Macdonald:[id number] DOB: 1945-09-19 DOA: 12/22/2015  PCP: Donnie Coffin, MD  Admit date: 12/22/2015 Discharge date: 12/24/2015  Recommendations for Outpatient Follow-up:  1. Follow up with PCP in 1 week 2. Obtain CBC for thrombyctopenia and anemia 3. GI follow-up for liver pathology (likely [place]) 4. Please follow up on the following pending results:  Pleural fluid culture   Discharge Condition: Stable CODE STATUS: Full code Diet recommendation: Heart Healthy / Carb Modified  Brief/Interim Summary: Randy Macdonald is a 70 y.o. male with medical history of diabetes mellitus, hypothyroidism, hyperlipidemia, coronary artery disease with history of stents to the LAD in 2010 presented with 1 month history of generalized weakness, fatigue, and some dyspnea on exertion.  Discharge Diagnoses:  Active Problems:   Thrombocytopenia ([place])   Hyperlipidemia   Coronary atherosclerosis of native coronary artery   Hypothyroidism   Essential hypertension, benign   Pleural effusion on right   Pleural effusion, right  Fatigue/generalized weakness/dyspnea on exertion Right pleural effusion Patient was found to have a right pleural effusion on x-ray. CT scan confirmed the diagnosis, but also, incidentally, found a lesion on the liver concerning for carcinoma and a prominent liver. Patient received a thoracentesis of his right pleural space which removed 1.2L of slightly hazy, yellow fluid. Patient felt relief shortly after, feeling back to baseline. Light's criteria was not met, and effusion is likely transudate. 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 Echocardiogram showed a normal EF of 16-38% with no diastolic dysfunction. AST, ALT within normal limits. Alkaline phosphatase slightly elevated.  Liver lesion Concerning for hepatocellular carcinoma. MRI obtained after discharge order placed (initially was going to be  performed late at night, so patient opted to perform outpatient). I have called the patient with the results and the concerns. I answered all questions at that time. Recommend follow-up with GI for possible biopsy.  Anemia, normocytic Down to 11.4 from 14.6 three years prior. Folate and B12 within normal limits. Iron study suggests anemia of chronic disease  Thrombocytopenia This has been chronic dating back to 2010. Likely related to liver pathology. Hepatitis B and C screen were negative  Chest discomfort Likely secondary to pleural effusion. Resolved. Troponin cycled and negative. EKG did not suggest NSTEMI or STEMI.  Diabetes mellitus type 2 Managed with sliding scale insulin. Hemoglobin A1C was 8.3  Coronary artery disease with history of stent Continued aspirin and Crestor  Hypothyroidism Continued Synthroid. TSH 1.566  Hypertension Continued losartan  Discharge Instructions  Discharge Instructions    Diet - low sodium heart healthy    Complete by:  As directed    Diet Carb Modified    Complete by:  As directed    Increase activity slowly    Complete by:  As directed        Medication List    TAKE these medications   aspirin 81 MG tablet Take 81 mg by mouth daily.   citalopram 20 MG tablet Commonly known as:  CELEXA Take 1 tablet (20 mg total) by mouth daily. What changed:  medication strength  how much to take  how to take this   CRESTOR 40 MG tablet Generic drug:  rosuvastatin 40 mg daily.   diclofenac 75 MG EC tablet Commonly known as:  VOLTAREN Take 75 mg by mouth daily.   fish oil-omega-3 fatty acids 1000 MG capsule 1,200 mg daily.   fluorouracil 5 % cream Commonly known as:  EFUDEX Apply topically as  needed (Rash----Per patient).   JANUMET XR [phone] MG Tb24 Generic drug:  SitaGLIPtin-MetFORMIN HCl Take 100-1,000 mg by mouth 2 (two) times daily.   levothyroxine 75 MCG tablet Commonly known as:  SYNTHROID, LEVOTHROID Take 75  mcg by mouth daily.   losartan 100 MG tablet Commonly known as:  COZAAR Take 1 tablet (100 mg total) by mouth daily.   NOVOLIN N RELION 100 UNIT/ML injection Generic drug:  insulin NPH Human Inject 75 Units into the skin daily before breakfast.   NOVOLIN R RELION 100 units/mL injection Generic drug:  insulin regular Inject 50 Units into the skin 3 (three) times daily before meals.      Follow-up Information    Donnie Coffin, MD. Schedule an appointment as soon as possible for a visit in 1 week(s).   Specialty:  Family Medicine Why:  Pleural effusion. Incidental liver finding on CT scan Contact information: [address] [place] [address] [phone]          Allergies  Allergen Reactions  . Codeine Nausea And Vomiting  . Sulfa Antibiotics Nausea And Vomiting    Consultations: Interventional Radiology  Procedures/Studies: Dg Chest 1 View  Result Date: 12/23/2015 CLINICAL DATA:  Status post thoracentesis. EXAM: CHEST 1 VIEW COMPARISON:  Chest CT 12/22/2015 FINDINGS: The cardiac silhouette, mediastinal and hilar contours are within normal limits and stable. Interval evacuation of the right-sided pleural effusion. No postprocedural pneumothorax is identified. IMPRESSION: Status post right-sided thoracentesis with evacuation of pleural fluid. No postprocedural pneumothorax. Electronically Signed   By: Marijo Sanes M.D.   On: 12/23/2015 11:06   Dg Chest 2 View  Result Date: 12/22/2015 CLINICAL DATA:  Fever chills and weakness for 3 weeks. EXAM: CHEST  2 VIEW COMPARISON:  None. FINDINGS: PA and lateral views of the chest. Mild streaky linear atelectasis in the medial left lung base. Left lung otherwise clear. Small to moderate right-sided pleural effusion. Consolidative process present in the right middle lobe and right lung base. Cardiomediastinal silhouette partially obscured. No pneumothorax. Atherosclerotic vascular calcification of the aorta.  Degenerative changes of the spine. IMPRESSION: 1. Small to moderate right-sided pleural effusion with right middle lobe and right lung base consolidations. 2. Streaky left lung base atelectasis. Electronically Signed   By: Donavan Foil M.D.   On: 12/22/2015 12:27   Ct Chest W Contrast  Result Date: 12/22/2015 CLINICAL DATA:  Short of breath, cough, fever for 3 weeks EXAM: CT CHEST WITH CONTRAST TECHNIQUE: Multidetector CT imaging of the chest was performed during intravenous contrast administration. CONTRAST:  78m ISOVUE-300 IOPAMIDOL (ISOVUE-300) INJECTION 61% COMPARISON:  Chest x-ray of 12/23/2005 FINDINGS: Cardiovascular: The heart is mildly enlarged. No pericardial effusion is seen. Coronary artery calcifications are present. The mid ascending thoracic aorta measures 3.6 cm. The pulmonary arteries are unremarkable. Moderate thoracic aortic atherosclerosis is present. Mediastinum/Nodes: No mediastinal or hilar adenopathy is seen. Lungs/Pleura: There is a moderate large right pleural effusion present. There is resultant partial atelectasis of the right lower lobe with mild linear atelectasis in the right middle lobe as well. The left lung is clear. No suspicious lung nodule or mass is seen. A calcified granuloma is present within the right upper lobe. Upper Abdomen: There is abnormal low-attenuation lesion within much of the left lobe of liver anteriorly. This could represent and unusual hepatic hemangioma, but hepatocellular carcinoma is a definite consideration and MRI of the abdomen is recommended to evaluate further. No intrahepatic ductal dilatation is seen. No calcified gallstones are noted.  Incidental 4 mm nonobstructing right upper pole calculus is present. The spleen is slightly prominent. Musculoskeletal: The thoracic vertebrae are in normal alignment with diffuse degenerative spurring present. IMPRESSION: 1. Moderately large right pleural effusion with partial atelectasis of the adjacent right  lower lobe and minimal linear atelectasis in the right middle lobe. 2. Abnormal low-attenuation lesion within the left lobe of liver worrisome for hepatocellular carcinoma. Recommend MRI of the abdomen to assess further. 3. Incidental 4 mm nonobstructing right upper pole renal calculus. 4. Moderate thoracic aortic atherosclerosis with coronary artery calcifications present. Cardiomegaly. Electronically Signed   By: Ivar Drape M.D.   On: 12/22/2015 14:45   Mr Abdomen W Wo Contrast  Result Date: 12/23/2015 CLINICAL DATA:  Indeterminate hepatic mass. Concern for metastatic carcinoma. EXAM: MRI ABDOMEN WITHOUT AND WITH CONTRAST TECHNIQUE: Multiplanar multisequence MR imaging of the abdomen was performed both before and after the administration of intravenous contrast. CONTRAST:  10 mL Eovist COMPARISON:  CT 12/22/2015 FINDINGS: Lower chest: Small RIGHT effusion. Airspace disease in the RIGHT lower lobe. Hepatobiliary: Lobular mass occupies the near entirety of the LEFT lateral hepatic lobe (segment 3). Mass is mildly hyperintense on T2 weighted imaging to normal liver parenchyma and measuring 10.3 by 6.9 cm in axial dimension. Second similar lesion in the central LEFT hepatic lobe (segment 4A measures 1.8 cm). These lesions demonstrate a peripheral continuous enhancement consistent with malignancy (series 501). Portal veins are patent.  No ascites. There is small amount pericholecystic fluid. Gallstones in the gallbladder. Gallbladder is mildly distended to 4 cm. Common bile duct is normal caliber. Pancreas: Normal pancreatic parenchymal intensity. No ductal dilatation or inflammation. Benign-appearing cyst measuring 12 mm in the body pancreas (image 29, series 13) Spleen: Spleen is enlarged.  Splenic vein is patent. Adrenals/urinary tract: Adrenal glands and kidneys are normal. Stomach/Bowel: Stomach and limited of the small bowel is unremarkable Vascular/Lymphatic: Mild venous collaterals in the gastrohepatic  ligament. Musculoskeletal: No aggressive osseous lesion IMPRESSION: 1. Large enhancing lesion in the lateral segment of the LEFT hepatic lobe and similar enhancing lesion in the central LEFT hepatic lobe consistent with malignancy. Differential consideration would include hepatocellular carcinoma, cholangiocarcinoma, or metastatic disease to liver. With evidence of underlying liver dysfunction, favor HCC. 2. Pericholecystic fluid and gallstones. Correlate for cholecystitis. 3. Benign-appearing cyst in the pancreas . 4. Splenomegaly and venous collaterals. Electronically Signed   By: Suzy Bouchard M.D.   On: 12/23/2015 19:25   US Thoracentesis Asp Pleural Space W/img Guide  Result Date: 12/23/2015 INDICATION: Patient with history of coronary artery disease , dyspnea, right pleural effusion. Request made for diagnostic and therapeutic right thoracentesis . EXAM: ULTRASOUND GUIDED DIAGNOSTIC AND THERAPEUTIC RIGHT THORACENTESIS MEDICATIONS: None. COMPLICATIONS: None immediate. PROCEDURE: An ultrasound guided thoracentesis was thoroughly discussed with the patient and questions answered. The benefits, risks, alternatives and complications were also discussed. The patient understands and wishes to proceed with the procedure. Written consent was obtained. Ultrasound was performed to localize and mark an adequate pocket of fluid in the right chest. The area was then prepped and draped in the normal sterile fashion. 1% Lidocaine was used for local anesthesia. Under ultrasound guidance a Safe-T-Centesis catheter was introduced. Thoracentesis was performed. The catheter was removed and a dressing applied. FINDINGS: A total of approximately 1.2 liters of slightly hazy, yellow fluid was removed. IMPRESSION: Successful ultrasound guided diagnostic and therapeutic right thoracentesis yielding 1.2 liters of pleural fluid. The patient did experience a mild vasovagal reaction during the procedure which responded well to  reverse Trendelenburg positioning and cold compress to the forehead. Read by: Rowe Robert, PA-C Electronically Signed   By: Sandi Mariscal M.D.   On: 12/23/2015 11:08    Paracentesis (12/23/2015)  Echocardiogram (12/23/2015) Study Conclusions  - Left ventricle: The cavity size was normal. There was mild   concentric hypertrophy. Systolic function was normal. The   estimated ejection fraction was in the range of 60% to 65%. Wall   motion was normal; there were no regional wall motion   abnormalities.   Subjective: Patient reports feeling much better since the paracentesis. No chest pain or dyspnea.  Discharge Exam: Vitals:   12/23/15 1046 12/23/15 1100  BP: 115/60 (!) 123/56  Pulse: 66 70  Resp:    Temp:     Vitals:   12/23/15 1024 12/23/15 1039 12/23/15 1046 12/23/15 1100  BP: 136/61 (!) 74/43 115/60 (!) 123/56  Pulse:  62 66 70  Resp:      Temp:      TempSrc:      SpO2:  96% 91%   Weight:      Height:        General: Pt is alert, awake, not in acute distress Cardiovascular: RRR, S1/S2 +, no rubs, no gallops Respiratory: Right fields are slightly diminished compared to left. No wheezing Abdominal: Soft, NT, ND, bowel sounds + Extremities: no edema, no cyanosis    The results of significant diagnostics from this hospitalization (including imaging, microbiology, ancillary and laboratory) are listed below for reference.     Microbiology: Recent Results (from the past 240 hour(s))  Gram stain     Status: None   Collection Time: 12/23/15 10:55 AM  Result Value Ref Range Status   Specimen Description FLUID PLEURAL RIGHT  Final   Special Requests NONE  Final   Gram Stain   Final    FEW WBC PRESENT,BOTH PMN AND MONONUCLEAR NO ORGANISMS SEEN    Report Status 12/23/2015 FINAL  Final     Labs: BNP (last 3 results) No results for input(s): BNP in the last 8760 hours. Basic Metabolic Panel:  Recent Labs Lab 12/22/15 1103 12/22/15 2234 12/23/15 1152  NA 134*   --  134*  K 3.6  --  3.7  CL 103  --  102  CO2 24  --  25  GLUCOSE 265*  --  276*  BUN 14  --  11  CREATININE 0.84  --  0.85  CALCIUM 8.9  --  8.5*  MG  --  1.5*  --    Liver Function Tests:  Recent Labs Lab 12/22/15 1103 12/22/15 2234 12/23/15 1152  AST 37 39 37  ALT 34 34 32  ALKPHOS 113 131* 128*  BILITOT 1.6* 0.8 1.0  PROT 6.5 6.4* 6.0*  ALBUMIN 2.7* 2.6* 2.4*   No results for input(s): LIPASE, AMYLASE in the last 168 hours. No results for input(s): AMMONIA in the last 168 hours. CBC:  Recent Labs Lab 12/22/15 1103 12/22/15 1930  WBC 4.4  --   NEUTROABS 3.3  --   HGB 11.4*  --   HCT 34.7* 33.1*  MCV 87.2  --   PLT 74*  --    Cardiac Enzymes:  Recent Labs Lab 12/22/15 1103 12/22/15 2234 12/23/15 0652 12/23/15 1152  CKTOTAL 54  --   --   --   TROPONINI  --  <0.03 <0.03 <0.03   BNP: Invalid input(s): POCBNP CBG:  Recent Labs Lab 12/22/15 2113 12/22/15 2358 12/23/15 0800 12/23/15 1149  12/23/15 1738  GLUCAP 305* 255* 221* 272* 213*   D-Dimer No results for input(s): DDIMER in the last 72 hours. Hgb A1c  Recent Labs  12/22/15 1930  HGBA1C 8.3*   Lipid Profile No results for input(s): CHOL, HDL, LDLCALC, TRIG, CHOLHDL, LDLDIRECT in the last 72 hours. Thyroid function studies  Recent Labs  12/22/15 1930  TSH 1.566   Anemia work up  Recent Labs  12/22/15 2234  VITAMINB12 415  FERRITIN 155  TIBC 272  IRON 22*   Urinalysis    Component Value Date/Time   COLORURINE AMBER (A) 12/22/2015 1445   APPEARANCEUR CLEAR 12/22/2015 1445   LABSPEC 1.025 12/22/2015 1445   PHURINE 6.0 12/22/2015 1445   GLUCOSEU 500 (A) 12/22/2015 1445   HGBUR NEGATIVE 12/22/2015 1445   BILIRUBINUR NEGATIVE 12/22/2015 1445   KETONESUR NEGATIVE 12/22/2015 1445   PROTEINUR NEGATIVE 12/22/2015 1445   NITRITE NEGATIVE 12/22/2015 1445   LEUKOCYTESUR NEGATIVE 12/22/2015 1445   Sepsis Labs Invalid input(s): PROCALCITONIN,  WBC,   LACTICIDVEN Microbiology Recent Results (from the past 240 hour(s))  Gram stain     Status: None   Collection Time: 12/23/15 10:55 AM  Result Value Ref Range Status   Specimen Description FLUID PLEURAL RIGHT  Final   Special Requests NONE  Final   Gram Stain   Final    FEW WBC PRESENT,BOTH PMN AND MONONUCLEAR NO ORGANISMS SEEN    Report Status 12/23/2015 FINAL  Final     Time coordinating discharge: Over 30 minutes  SIGNED:   [name], MD  [facility] 12/24/2015, 7:28 PM Pager [phone]  If 7PM-7AM, please contact night-coverage [email or web address] Password [email or web address]

## 2015-12-24 NOTE — Telephone Encounter (Signed)
Called patient to discuss results of MRI. This mode of informing was definitely less than what I would have preferred for the potential diagnosis to be discussed. I informed Mr. Randy Macdonald of the MRI results and that they were suspicious for liver cancer. He states that he has an appointment with Dr. Alroy Dust this Monday, 9/18, and plans to discuss his admission at that time. Informed patient that he will need to also follow-up with a gastroenterologist. He will discuss this with Dr. Alroy Dust at the time of his visit. All questions were answered during the call. Patient was appreciative of the call. He reports continuing to be symptom free.  [name], MD [facility] 12/24/2015, 7:27 PM Pager: [phone]

## 2015-12-28 ENCOUNTER — Encounter: Payer: Self-pay | Admitting: Hematology

## 2015-12-28 ENCOUNTER — Other Ambulatory Visit: Payer: Self-pay | Admitting: Family Medicine

## 2015-12-28 ENCOUNTER — Telehealth: Payer: Self-pay | Admitting: Hematology

## 2015-12-28 ENCOUNTER — Ambulatory Visit
Admission: RE | Admit: 2015-12-28 | Discharge: 2015-12-28 | Disposition: A | Discharge status: Home / Self Care | Payer: Medicare Other | Source: Ambulatory Visit | Attending: Family Medicine | Admitting: Family Medicine

## 2015-12-28 DIAGNOSIS — J90 Pleural effusion, not elsewhere classified: Secondary | ICD-10-CM

## 2015-12-28 LAB — CULTURE, BODY FLUID-BOTTLE: CULTURE: NO GROWTH

## 2015-12-28 LAB — CULTURE, BODY FLUID W GRAM STAIN -BOTTLE

## 2015-12-28 NOTE — Telephone Encounter (Signed)
Pt's wife confirmed appt, completed intake, mailed pt letter, faxed referring provider appt date/time

## 2016-01-03 ENCOUNTER — Encounter: Payer: Self-pay | Admitting: Hematology

## 2016-01-03 ENCOUNTER — Other Ambulatory Visit (HOSPITAL_BASED_OUTPATIENT_CLINIC_OR_DEPARTMENT_OTHER): Payer: Medicare Other

## 2016-01-03 ENCOUNTER — Ambulatory Visit (HOSPITAL_BASED_OUTPATIENT_CLINIC_OR_DEPARTMENT_OTHER): Payer: Medicare Other | Admitting: Hematology

## 2016-01-03 VITALS — BP 129/56 | HR 87 | Temp 98.3°F

## 2016-01-03 DIAGNOSIS — E669 Obesity, unspecified: Secondary | ICD-10-CM

## 2016-01-03 DIAGNOSIS — D696 Thrombocytopenia, unspecified: Secondary | ICD-10-CM

## 2016-01-03 DIAGNOSIS — E785 Hyperlipidemia, unspecified: Secondary | ICD-10-CM

## 2016-01-03 DIAGNOSIS — C801 Malignant (primary) neoplasm, unspecified: Secondary | ICD-10-CM

## 2016-01-03 DIAGNOSIS — R160 Hepatomegaly, not elsewhere classified: Secondary | ICD-10-CM | POA: Diagnosis not present

## 2016-01-03 DIAGNOSIS — I2510 Atherosclerotic heart disease of native coronary artery without angina pectoris: Secondary | ICD-10-CM

## 2016-01-03 DIAGNOSIS — J90 Pleural effusion, not elsewhere classified: Secondary | ICD-10-CM

## 2016-01-03 DIAGNOSIS — I10 Essential (primary) hypertension: Secondary | ICD-10-CM

## 2016-01-03 DIAGNOSIS — K219 Gastro-esophageal reflux disease without esophagitis: Secondary | ICD-10-CM

## 2016-01-03 DIAGNOSIS — E119 Type 2 diabetes mellitus without complications: Secondary | ICD-10-CM

## 2016-01-03 MED ORDER — SENNOSIDES-DOCUSATE SODIUM 8.6-50 MG PO TABS: 2.0000 | ORAL_TABLET | Freq: Every day | ORAL | 1 refills | Status: DC

## 2016-01-03 MED ORDER — PHYTONADIONE 5 MG PO TABS: 5.0000 mg | ORAL_TABLET | Freq: Every day | ORAL | 0 refills | Status: DC

## 2016-01-03 NOTE — Progress Notes (Signed)
Marland Kitchen    HEMATOLOGY/ONCOLOGY CONSULTATION NOTE  Date of Service: 01/03/2016  Patient Care Team: L.Donnie Coffin, MD as PCP - General (Family Medicine) Jacelyn Pi, MD as Consulting Physician (Endocrinology)  CHIEF COMPLAINTS/PURPOSE OF CONSULTATION:  Liver masses  HISTORY OF PRESENTING ILLNESS:   Randy Macdonald is a wonderful 70 y.o. male who has been referred to [place] by Dr .Donnie Coffin, MD for evaluation and management of newly diagnosed left hepatic masses concerning for neoplastic etiology.  Patient has a history of hypertension, dyslipidemia, diabetes, hypothyroidism, coronary artery disease status post PCI in September 2010, GERD, eczema, depression who recently presented to the emergency room on 12/22/2015 with symptoms of progressive fatigue, myalgias, dyspnea on exertion and shortness of breath for a  about a month.  He was noted to have a large right-sided pleural effusion. CT of the chest showed a large right-sided pleural effusion was also incidentally noted to have a lesion on his liver concerning for carcinoma. Patient had a thoracentesis of his right pleural space and removal of a large amount of right-sided pleural effusion with improvement in his shortness of breath. Fluid was noted to be transudative. Cytology showed reactive mesothelial cells with no overt cancer cells. Patient had an echocardiogram that showed normal ejection fraction of [id number] with diastolic dysfunction. Patient had an evaluation of his liver lesion with an MRI of the abdomen on 12/23/2015 which showed a large enhancing lesion in the lateral segment of the left hepatic lobe measuring 10.3 x 6.9 cm. A smaller second lesion was noted in the central left hepatic lobe measuring 1.8 cm. There was peripheral enhancement consistent with malignancy. Portal vein was noted to be patent and he did not have any significant ascites. He was noted to have splenomegaly with venous collaterals suggesting portal hypertension. No  other clear primary was identified in the abdomen.  Patient had an AFP tumor marker which was normal with a level of 1. Hepatitis B surface antigen negative, hepatitis C antibody negative. LDH somewhat elevated at 273.  Patient notes some epigastric abdominal discomfort on palpation. Notes weight loss of about 20-30 pounds in the last several months.  He has had chronic thrombocytopenia from around 2010. Uncertain that this was worked up. Likely related to his splenomegaly.  MEDICAL HISTORY:  Past Medical History:  Diagnosis Date  . Anemia   . Arthritis    "plenty" (12/22/2015)  . CAD (coronary artery disease)    a. LHC 10/10: pLAD 80, dLAD 70, oD1 50, D2 70, EF 55% >> PCI: 2.5 x 18 mm Endeavor DES to mLAD and 3 x 12 mm Endeavor DES to pLAD   . Depression   . GERD (gastroesophageal reflux disease)   . HTN (hypertension)   . Hyperlipidemia   . Hypothyroidism   . Leukopenia 09/01/2011  . Macrocytosis without anemia 09/01/2011  . Osteoarthritis   . Pleural effusion on right 12/22/2015  . Skin cancer    "right cheek"  . Thrombocytopenia ([place]) 09/01/2011  . Type II diabetes mellitus ([place])     SURGICAL HISTORY: Past Surgical History:  Procedure Laterality Date  . CORONARY ANGIOPLASTY WITH STENT PLACEMENT Bilateral 01/2009   "2 stents"  . HERNIA REPAIR    . SKIN CANCER EXCISION Right    "cheek"  . TONSILLECTOMY  1966  . UMBILICAL HERNIA REPAIR  1990s    SOCIAL HISTORY: Social History   Social History  . Marital status: Married    Spouse name: N/A  . Number of children: N/A  .  Years of education: N/A   Occupational History  . Not on file.   Social History Main Topics  . Smoking status: Former Smoker    Years: 4.00    Types: Cigarettes    Quit date: 06/14/1963  . Smokeless tobacco: Never Used  . Alcohol use No     Comment: "drank a little bit when I was young"  . Drug use: No  . Sexual activity: Not Currently   Other Topics Concern  . Not on file   Social  History Narrative  . No narrative on file    FAMILY HISTORY: Family History  Problem Relation Age of Onset  . Thyroid disease Sister   . CAD Brother     ALLERGIES:  is allergic to codeine and sulfa antibiotics.  MEDICATIONS:  Current Outpatient Prescriptions  Medication Sig Dispense Refill  . aspirin 81 MG tablet Take 81 mg by mouth daily.    . citalopram (CELEXA) 20 MG tablet Take 1 tablet (20 mg total) by mouth daily. 30 tablet 0  . CRESTOR 40 MG tablet 40 mg daily.     . diclofenac (VOLTAREN) 75 MG EC tablet Take 75 mg by mouth daily.    . fish oil-omega-3 fatty acids 1000 MG capsule 1,200 mg daily.    . fluorouracil (EFUDEX) 5 % cream Apply topically as needed (Rash----Per patient).     Marland Kitchen JANUMET XR [phone] MG TB24 Take 100-1,000 mg by mouth 2 (two) times daily.    Marland Kitchen levothyroxine (SYNTHROID, LEVOTHROID) 75 MCG tablet Take 75 mcg by mouth daily.    Marland Kitchen losartan (COZAAR) 100 MG tablet Take 1 tablet (100 mg total) by mouth daily. 90 tablet 3  . NOVOLIN N RELION 100 UNIT/ML injection Inject 75 Units into the skin daily before breakfast.     . NOVOLIN R RELION 100 UNIT/ML injection Inject 50 Units into the skin 3 (three) times daily before meals.     . phytonadione (VITAMIN K) 5 MG tablet Take 1 tablet (5 mg total) by mouth daily. 5 tablet 0  . senna-docusate (SENNA S) 8.6-50 MG tablet Take 2 tablets by mouth at bedtime. 60 tablet 1   No current facility-administered medications for this visit.     REVIEW OF SYSTEMS:    10 Point review of Systems was done is negative except as noted above.  PHYSICAL EXAMINATION: ECOG PERFORMANCE STATUS: 2 - Symptomatic, <50% confined to bed  . Vitals:   01/03/16 1626  BP: (!) 129/56  Pulse: 87  Temp: 98.3 F (36.8 C)   GENERAL:alert, in no acute distress and comfortable SKIN: skin color, texture, turgor are normal, no rashes or significant lesions EYES: normal, conjunctiva are pink and non-injected, sclera clear OROPHARYNX:no  exudate, no erythema and lips, buccal mucosa, and tongue normal  NECK: supple, no JVD, thyroid normal size, non-tender, without nodularity LYMPH:  no palpable lymphadenopathy in the cervical, axillary or inguinal LUNGS: clear to auscultation with normal respiratory effort HEART: regular rate & rhythm,  no murmurs and no lower extremity edema ABDOMEN: abdomen soft, non-tender, normoactive bowel sounds  Musculoskeletal: no cyanosis of digits and no clubbing  PSYCH: alert & oriented x 3 with fluent speech NEURO: no focal motor/sensory deficits  LABORATORY DATA:  I have reviewed the data as listed  . CBC Latest Ref Rng & Units 12/22/2015 12/22/2015 05/06/2012  WBC 4.0 - 10.5 K/uL 4.4 - 3.7(L)  Hemoglobin 13.0 - 17.0 g/dL 11.4(L) - 14.6  Hematocrit 37.5 - 51.0 % 33.1(L) 34.7(L) 42.3  Platelets 150 - 400 K/uL 74(L) - 61(L)    . CMP Latest Ref Rng & Units 12/23/2015 12/22/2015 12/22/2015  Glucose 65 - 99 mg/dL 276(H) - 265(H)  BUN 6 - 20 mg/dL 11 - 14  Creatinine 0.61 - 1.24 mg/dL 0.85 - 0.84  Sodium 135 - 145 mmol/L 134(L) - 134(L)  Potassium 3.5 - 5.1 mmol/L 3.7 - 3.6  Chloride 101 - 111 mmol/L 102 - 103  CO2 22 - 32 mmol/L 25 - 24  Calcium 8.9 - 10.3 mg/dL 8.5(L) - 8.9  Total Protein 6.5 - 8.1 g/dL 6.0(L) 6.4(L) 6.5  Total Bilirubin 0.3 - 1.2 mg/dL 1.0 0.8 1.6(H)  Alkaline Phos 38 - 126 U/L 128(H) 131(H) 113  AST 15 - 41 U/L 37 39 37  ALT 17 - 63 U/L 32 34 34   Component     Latest Ref Rng & Units 12/22/2015 12/23/2015 12/23/2015          6:52 AM  6:22 PM  Iron     45 - 182 ug/dL 22 (L)    TIBC     250 - 450 ug/dL 272    Saturation Ratios     17.9 - 39.5 % 8 (L)    UIBC     ug/dL 250    Creatinine, Urine     mg/dL 78.34    Total Protein, Urine     mg/dL 7    Protein Creatinine Ratio     0.00 - 0.15 mg/mgCre 0.09    Folate, Hemolysate     Not Estab. ng/mL 391.0    HCT     37.5 - 51.0 % 33.1 (L)    Folate, RBC     >498 ng/mL 1,181    Prothrombin Time     11.4 - 15.2  seconds 16.0 (H)    INR      1.27    TSH     0.350 - 4.500 uIU/mL 1.566    APTT     24 - 36 seconds 24    LDH     98 - 192 U/L 273 (H)    Hepatitis B Surface Ag     Negative  Negative   HCV Ab     0.0 - 0.9 s/co ratio <0.1    Vitamin B12     180 - 914 pg/mL 415    Ferritin     24 - 336 ng/mL 155    AFP Tumor Marker     0.0 - 8.3 ng/mL   1.0    RADIOGRAPHIC STUDIES: I have personally reviewed the radiological images as listed and agreed with the findings in the report. Dg Chest 1 View  Result Date: 12/23/2015 CLINICAL DATA:  Status post thoracentesis. EXAM: CHEST 1 VIEW COMPARISON:  Chest CT 12/22/2015 FINDINGS: The cardiac silhouette, mediastinal and hilar contours are within normal limits and stable. Interval evacuation of the right-sided pleural effusion. No postprocedural pneumothorax is identified. IMPRESSION: Status post right-sided thoracentesis with evacuation of pleural fluid. No postprocedural pneumothorax. Electronically Signed   By: Marijo Sanes M.D.   On: 12/23/2015 11:06   Dg Chest 2 View  Result Date: 12/28/2015 CLINICAL DATA:  Pleural effusion. EXAM: CHEST  2 VIEW COMPARISON:  12/23/2015.  12/22/2015.  CT 12/22/2015 . FINDINGS: Mediastinum and hilar structures normal. Mild stable cardiomegaly. Mild right base atelectasis and or infiltrate cannot be excluded. Small right pleural effusion cannot be excluded. Heart size normal . IMPRESSION: 1. Mild right base atelectasis and or  infiltrate cannot be excluded. Small right pleural effusion cannot be excluded. Findings have decreased significantly from prior plain films CT of 12/22/2015. 2. Mild stable cardiomegaly. Electronically Signed   By: Marcello Moores  Register   On: 12/28/2015 13:05   Dg Chest 2 View  Result Date: 12/22/2015 CLINICAL DATA:  Fever chills and weakness for 3 weeks. EXAM: CHEST  2 VIEW COMPARISON:  None. FINDINGS: PA and lateral views of the chest. Mild streaky linear atelectasis in the medial left lung base.  Left lung otherwise clear. Small to moderate right-sided pleural effusion. Consolidative process present in the right middle lobe and right lung base. Cardiomediastinal silhouette partially obscured. No pneumothorax. Atherosclerotic vascular calcification of the aorta. Degenerative changes of the spine. IMPRESSION: 1. Small to moderate right-sided pleural effusion with right middle lobe and right lung base consolidations. 2. Streaky left lung base atelectasis. Electronically Signed   By: Donavan Foil M.D.   On: 12/22/2015 12:27   Ct Chest W Contrast  Result Date: 12/22/2015 CLINICAL DATA:  Short of breath, cough, fever for 3 weeks EXAM: CT CHEST WITH CONTRAST TECHNIQUE: Multidetector CT imaging of the chest was performed during intravenous contrast administration. CONTRAST:  35mL ISOVUE-300 IOPAMIDOL (ISOVUE-300) INJECTION 61% COMPARISON:  Chest x-ray of 12/23/2005 FINDINGS: Cardiovascular: The heart is mildly enlarged. No pericardial effusion is seen. Coronary artery calcifications are present. The mid ascending thoracic aorta measures 3.6 cm. The pulmonary arteries are unremarkable. Moderate thoracic aortic atherosclerosis is present. Mediastinum/Nodes: No mediastinal or hilar adenopathy is seen. Lungs/Pleura: There is a moderate large right pleural effusion present. There is resultant partial atelectasis of the right lower lobe with mild linear atelectasis in the right middle lobe as well. The left lung is clear. No suspicious lung nodule or mass is seen. A calcified granuloma is present within the right upper lobe. Upper Abdomen: There is abnormal low-attenuation lesion within much of the left lobe of liver anteriorly. This could represent and unusual hepatic hemangioma, but hepatocellular carcinoma is a definite consideration and MRI of the abdomen is recommended to evaluate further. No intrahepatic ductal dilatation is seen. No calcified gallstones are noted. Incidental 4 mm nonobstructing right upper  pole calculus is present. The spleen is slightly prominent. Musculoskeletal: The thoracic vertebrae are in normal alignment with diffuse degenerative spurring present. IMPRESSION: 1. Moderately large right pleural effusion with partial atelectasis of the adjacent right lower lobe and minimal linear atelectasis in the right middle lobe. 2. Abnormal low-attenuation lesion within the left lobe of liver worrisome for hepatocellular carcinoma. Recommend MRI of the abdomen to assess further. 3. Incidental 4 mm nonobstructing right upper pole renal calculus. 4. Moderate thoracic aortic atherosclerosis with coronary artery calcifications present. Cardiomegaly. Electronically Signed   By: Ivar Drape M.D.   On: 12/22/2015 14:45   Mr Abdomen W Wo Contrast  Result Date: 12/23/2015 CLINICAL DATA:  Indeterminate hepatic mass. Concern for metastatic carcinoma. EXAM: MRI ABDOMEN WITHOUT AND WITH CONTRAST TECHNIQUE: Multiplanar multisequence MR imaging of the abdomen was performed both before and after the administration of intravenous contrast. CONTRAST:  10 mL Eovist COMPARISON:  CT 12/22/2015 FINDINGS: Lower chest: Small RIGHT effusion. Airspace disease in the RIGHT lower lobe. Hepatobiliary: Lobular mass occupies the near entirety of the LEFT lateral hepatic lobe (segment 3). Mass is mildly hyperintense on T2 weighted imaging to normal liver parenchyma and measuring 10.3 by 6.9 cm in axial dimension. Second similar lesion in the central LEFT hepatic lobe (segment 4A measures 1.8 cm). These lesions demonstrate a peripheral continuous  enhancement consistent with malignancy (series 501). Portal veins are patent.  No ascites. There is small amount pericholecystic fluid. Gallstones in the gallbladder. Gallbladder is mildly distended to 4 cm. Common bile duct is normal caliber. Pancreas: Normal pancreatic parenchymal intensity. No ductal dilatation or inflammation. Benign-appearing cyst measuring 12 mm in the body pancreas (image  29, series 13) Spleen: Spleen is enlarged.  Splenic vein is patent. Adrenals/urinary tract: Adrenal glands and kidneys are normal. Stomach/Bowel: Stomach and limited of the small bowel is unremarkable Vascular/Lymphatic: Mild venous collaterals in the gastrohepatic ligament. Musculoskeletal: No aggressive osseous lesion IMPRESSION: 1. Large enhancing lesion in the lateral segment of the LEFT hepatic lobe and similar enhancing lesion in the central LEFT hepatic lobe consistent with malignancy. Differential consideration would include hepatocellular carcinoma, cholangiocarcinoma, or metastatic disease to liver. With evidence of underlying liver dysfunction, favor HCC. 2. Pericholecystic fluid and gallstones. Correlate for cholecystitis. 3. Benign-appearing cyst in the pancreas . 4. Splenomegaly and venous collaterals. Electronically Signed   By: Suzy Bouchard M.D.   On: 12/23/2015 19:25   US Thoracentesis Asp Pleural Space W/img Guide  Result Date: 12/23/2015 INDICATION: Patient with history of coronary artery disease , dyspnea, right pleural effusion. Request made for diagnostic and therapeutic right thoracentesis . EXAM: ULTRASOUND GUIDED DIAGNOSTIC AND THERAPEUTIC RIGHT THORACENTESIS MEDICATIONS: None. COMPLICATIONS: None immediate. PROCEDURE: An ultrasound guided thoracentesis was thoroughly discussed with the patient and questions answered. The benefits, risks, alternatives and complications were also discussed. The patient understands and wishes to proceed with the procedure. Written consent was obtained. Ultrasound was performed to localize and mark an adequate pocket of fluid in the right chest. The area was then prepped and draped in the normal sterile fashion. 1% Lidocaine was used for local anesthesia. Under ultrasound guidance a Safe-T-Centesis catheter was introduced. Thoracentesis was performed. The catheter was removed and a dressing applied. FINDINGS: A total of approximately 1.2 liters of  slightly hazy, yellow fluid was removed. IMPRESSION: Successful ultrasound guided diagnostic and therapeutic right thoracentesis yielding 1.2 liters of pleural fluid. The patient did experience a mild vasovagal reaction during the procedure which responded well to reverse Trendelenburg positioning and cold compress to the forehead. Read by: Rowe Robert, PA-C Electronically Signed   By: Sandi Mariscal M.D.   On: 12/23/2015 11:08    ASSESSMENT & PLAN:   70 year old Caucasian male with history of hypertension, dyslipidemia, diabetes, coronary artery disease with  1) newly diagnosed large left hepatic mass about 10 cm in size with a second left hepatic mass about 1.8 cm in size in the setting of likely chronic liver disease with evidence of portal hypertension and splenomegaly. Hepatitis B and C negative. Concern for underlying liver cirrhosis due NASH  Borderline elevated prothrombin time and hypoalbuminemia Child Pugh Class B (score 7)  Hepatocellular carcinoma is likely the primary differential. However since AFP tumor marker levels are within normal limits would need to consider the possibility of intrahepatic cholangiocarcinoma and metastatic carcinoma unknown primary.  2) chronic thrombocytopenia likely related to liver disease and splenomegaly causing hypersplenism.  3) recent symptomatic right pleural effusion  -likely related to liver disease . He was transudative and cytology showed only reactive mesothelial cells and no malignant cells .  PLAN -Labs including CBC with differential, CMP -repeat AFP tumor marker -CEA and CA-19-9 as markers for possible cholangiocarcinoma . -Patient reports that he had a colonoscopy about 4-5 years ago which he reports was negative except for 1 polyp that was removed . -PET/CT scan to  evaluate for other potential primary tumor and to stage the tumor with regards to surgical options . -CT-guided biopsy of the left liver mass by interventional radiology . -No  ascites noted on MRI that can be tapped . -Depending on findings of imaging and pathology might need to consider surgical evaluation if the tumor is localized to the liver . -Further management as per imaging and pathology results . -Given prescription for vitamin K for 5 days to help correct any vitamin K responsive quite a lot seen in the setting of liver disease . Platelets appear adequate for the biopsy . If any concerns of bleeding would transfuse platelets . -Might need nutritional therapy input on diagnosis . -Would need to monitor for reaccumulating pleural effusion. 4) . Patient Active Problem List   Diagnosis Date Noted  . Liver lesion 12/24/2015  . Pleural effusion on right 12/22/2015  . Pleural effusion, right 12/22/2015  . Obesity 01/28/2013  . Essential hypertension, benign 01/28/2013  . Hyperlipidemia   . Coronary atherosclerosis of native coronary artery   . Hypothyroidism   . Diabetes ([place])   . GERD (gastroesophageal reflux disease)   . Leukopenia 09/01/2011  . Thrombocytopenia ([place]) 09/01/2011  . Macrocytosis without anemia 09/01/2011   Plan -Continue follow-up with primary care physician for management of other chronic medical comorbidities . -Might need to consider pros and cons of continuing aspirin if platelet counts drop below 50,000 . -Consider GI consultation for evaluation and management of underlying liver disease .  All of the patients, his wife's and daughters  questions were answerein details to their apparent satisfaction. The patient knows to call the clinic with any problems, questions or concerns.  I spent 60 minutes counseling the patient face to face. The total time spent in the appointment was 70 minutes and more than 50% was on counseling and direct patient cares.    Sullivan Lone MD [place] AAHIVMS [facility] Hematology/Oncology Physician [facility]  (Office):       [phone] (Work cell):  [phone] (Fax):            [phone]  01/03/2016 2:47 PM

## 2016-01-05 ENCOUNTER — Telehealth: Payer: Self-pay | Admitting: Hematology

## 2016-01-05 ENCOUNTER — Telehealth: Payer: Self-pay | Admitting: *Deleted

## 2016-01-05 NOTE — Telephone Encounter (Signed)
Spoke with patient re f/u 10/10. Central radiology will contact patient re scan/bx. Patient also needed to return for lab per 9/25 los and nurse navigator. Per patient he received a call from our office yesterday informing him that he did not need to return for lab. Left voicemail message for nurse navigator [facility]) to confirm.

## 2016-01-05 NOTE — Telephone Encounter (Signed)
Oncology Nurse Navigator Documentation  Oncology Nurse Navigator Flowsheets 01/05/2016  Navigator Location [facility]  Navigator Encounter Type Telephone  Telephone Outgoing Call;Appt Confirmation/Clarification  Barriers/Navigation Needs Coordination of Care--PET  Interventions Coordination of Care  Coordination of Care Radiology  Acuity Level 2  Time Spent with Patient 15  Scheduled PET for 10/04 at 0730/0800 at [facility]. Needs to be NPO 6 hours prior and hold insulin 8 hours prior. Left VM for him to call navigator back for the appointment and mailed the appointment and instructions to his home.

## 2016-01-06 ENCOUNTER — Ambulatory Visit: Payer: Medicare Other | Admitting: Hematology

## 2016-01-06 ENCOUNTER — Telehealth: Payer: Self-pay | Admitting: *Deleted

## 2016-01-06 NOTE — Telephone Encounter (Signed)
Oncology Nurse Navigator Documentation  Oncology Nurse Navigator Flowsheets 01/06/2016  Navigator Location [facility]  Navigator Encounter Type Telephone  Telephone Outgoing Call;Appt Confirmation/Clarification  Barriers/Navigation Needs Coordination of Care--PET & labs  Interventions Coordination of Care--informed him Dr. Irene Limbo wants the labs asap. Patient agreed to Friday am.  Coordination of Care Appts;Radiology--provided w/PET appointment and prep  Acuity -  Time Spent with Patient 15  Informed him that radiology will call him with the biopsy appointment.

## 2016-01-07 ENCOUNTER — Telehealth: Payer: Self-pay | Admitting: *Deleted

## 2016-01-07 ENCOUNTER — Other Ambulatory Visit (HOSPITAL_BASED_OUTPATIENT_CLINIC_OR_DEPARTMENT_OTHER): Payer: Medicare Other

## 2016-01-07 DIAGNOSIS — C801 Malignant (primary) neoplasm, unspecified: Secondary | ICD-10-CM

## 2016-01-07 DIAGNOSIS — R160 Hepatomegaly, not elsewhere classified: Secondary | ICD-10-CM | POA: Diagnosis not present

## 2016-01-07 DIAGNOSIS — I2510 Atherosclerotic heart disease of native coronary artery without angina pectoris: Secondary | ICD-10-CM | POA: Diagnosis not present

## 2016-01-07 LAB — COMPREHENSIVE METABOLIC PANEL
ALT: 37 U/L (ref 0–55)
AST: 42 U/L — AB (ref 5–34)
Albumin: 2.6 g/dL — ABNORMAL LOW (ref 3.5–5.0)
Alkaline Phosphatase: 217 U/L — ABNORMAL HIGH (ref 40–150)
Anion Gap: 11 mEq/L (ref 3–11)
BUN: 13.5 mg/dL (ref 7.0–26.0)
CALCIUM: 9.1 mg/dL (ref 8.4–10.4)
CHLORIDE: 102 meq/L (ref 98–109)
CO2: 25 meq/L (ref 22–29)
CREATININE: 0.9 mg/dL (ref 0.7–1.3)
EGFR: 87 mL/min/{1.73_m2} — ABNORMAL LOW (ref >90)
Glucose: 272 mg/dl — ABNORMAL HIGH (ref 70–140)
POTASSIUM: 4.2 meq/L (ref 3.5–5.1)
SODIUM: 138 meq/L (ref 136–145)
Total Bilirubin: 0.81 mg/dL (ref 0.20–1.20)
Total Protein: 7.2 g/dL (ref 6.4–8.3)

## 2016-01-07 LAB — PROTIME-INR
INR: 1.2 — ABNORMAL LOW (ref 2.00–3.50)
PROTIME: 14.4 s — AB (ref 10.6–13.4)

## 2016-01-07 LAB — CBC & DIFF AND RETIC
BASO%: 0.5 % (ref 0.0–2.0)
BASOS ABS: 0 10*3/uL (ref 0.0–0.1)
EOS%: 2.3 % (ref 0.0–7.0)
Eosinophils Absolute: 0.1 10*3/uL (ref 0.0–0.5)
HCT: 35.1 % — ABNORMAL LOW (ref 38.4–49.9)
HGB: 11.3 g/dL — ABNORMAL LOW (ref 13.0–17.1)
Immature Retic Fract: 18.2 % — ABNORMAL HIGH (ref 3.00–10.60)
LYMPH#: 0.5 10*3/uL — AB (ref 0.9–3.3)
LYMPH%: 10.5 % — ABNORMAL LOW (ref 14.0–49.0)
MCH: 27.8 pg (ref 27.2–33.4)
MCHC: 32.2 g/dL (ref 32.0–36.0)
MCV: 86.5 fL (ref 79.3–98.0)
MONO#: 0.5 10*3/uL (ref 0.1–0.9)
MONO%: 10.3 % (ref 0.0–14.0)
NEUT%: 76.4 % — ABNORMAL HIGH (ref 39.0–75.0)
NEUTROS ABS: 3.3 10*3/uL (ref 1.5–6.5)
Platelets: 91 10*3/uL — ABNORMAL LOW (ref 140–400)
RBC: 4.06 10*6/uL — AB (ref 4.20–5.82)
RDW: 15.2 % — AB (ref 11.0–14.6)
RETIC %: 1.59 % (ref 0.80–1.80)
RETIC CT ABS: 64.55 10*3/uL (ref 34.80–93.90)
WBC: 4.4 10*3/uL (ref 4.0–10.3)

## 2016-01-07 LAB — LACTATE DEHYDROGENASE: LDH: 393 U/L — AB (ref 125–245)

## 2016-01-07 LAB — CEA (IN HOUSE-CHCC): CEA (CHCC-In House): 4.15 ng/mL (ref 0.00–5.00)

## 2016-01-07 NOTE — Telephone Encounter (Signed)
  Oncology Nurse Navigator Documentation  Navigator Location: [facility] (01/07/16 1450) Navigator Encounter Type: Telephone (01/07/16 1450) Telephone: Appt Confirmation/Clarification (01/07/16 1450)   Called radiology scheduling to follow up on status of biopsy appointment. Per Elmo Putt, she has called patient and he has not returned her call. Navigator called patient and he was not aware of a message. Provided him with call back # for radiology and to ask for Elmo Putt to set up his biopsy. He says he will call now.

## 2016-01-08 LAB — CANCER ANTIGEN 19-9: CA 19-9: 25 U/mL (ref 0–35)

## 2016-01-08 LAB — PSA: PROSTATE SPECIFIC AG, SERUM: 1.1 ng/mL (ref 0.0–4.0)

## 2016-01-08 LAB — AFP TUMOR MARKER: AFP, SERUM, TUMOR MARKER: 1.7 ng/mL (ref 0.0–8.3)

## 2016-01-11 ENCOUNTER — Telehealth: Payer: Self-pay | Admitting: *Deleted

## 2016-01-11 NOTE — Telephone Encounter (Signed)
  Oncology Nurse Navigator Documentation  Navigator Location: [facility] (01/11/16 1715) Navigator Encounter Type: Telephone (01/11/16 1715) Telephone: [name] Call;Appt Confirmation/Clarification (01/11/16 1715)   Called patient to reschedule his office visit with Dr. Irene Limbo for 01/20/16 at 4pm since his biopsy is on 10/10. Patient agrees to change.

## 2016-01-12 ENCOUNTER — Ambulatory Visit (HOSPITAL_COMMUNITY)
Admission: RE | Admit: 2016-01-12 | Discharge: 2016-01-12 | Disposition: A | Discharge status: Home / Self Care | Payer: Medicare Other | Source: Ambulatory Visit | Attending: Hematology | Admitting: Hematology

## 2016-01-12 DIAGNOSIS — N200 Calculus of kidney: Secondary | ICD-10-CM | POA: Diagnosis not present

## 2016-01-12 DIAGNOSIS — K8080 Other cholelithiasis without obstruction: Secondary | ICD-10-CM | POA: Insufficient documentation

## 2016-01-12 DIAGNOSIS — M898X8 Other specified disorders of bone, other site: Secondary | ICD-10-CM | POA: Diagnosis not present

## 2016-01-12 DIAGNOSIS — K429 Umbilical hernia without obstruction or gangrene: Secondary | ICD-10-CM | POA: Insufficient documentation

## 2016-01-12 DIAGNOSIS — C801 Malignant (primary) neoplasm, unspecified: Secondary | ICD-10-CM

## 2016-01-12 DIAGNOSIS — R188 Other ascites: Secondary | ICD-10-CM | POA: Insufficient documentation

## 2016-01-12 DIAGNOSIS — I2510 Atherosclerotic heart disease of native coronary artery without angina pectoris: Secondary | ICD-10-CM | POA: Insufficient documentation

## 2016-01-12 DIAGNOSIS — K439 Ventral hernia without obstruction or gangrene: Secondary | ICD-10-CM | POA: Diagnosis not present

## 2016-01-12 DIAGNOSIS — R160 Hepatomegaly, not elsewhere classified: Secondary | ICD-10-CM | POA: Diagnosis not present

## 2016-01-12 DIAGNOSIS — R918 Other nonspecific abnormal finding of lung field: Secondary | ICD-10-CM | POA: Insufficient documentation

## 2016-01-12 LAB — GLUCOSE, CAPILLARY: GLUCOSE-CAPILLARY: 127 mg/dL — AB (ref 65–99)

## 2016-01-12 MED ORDER — FLUDEOXYGLUCOSE F - 18 (FDG) INJECTION
9.5100 | Freq: Once | INTRAVENOUS | Status: AC | PRN
  Administered 2016-01-12: 9.51 via INTRAVENOUS

## 2016-01-14 ENCOUNTER — Other Ambulatory Visit: Payer: Self-pay | Admitting: Radiology

## 2016-01-18 ENCOUNTER — Ambulatory Visit: Payer: Medicare Other | Admitting: Hematology

## 2016-01-18 ENCOUNTER — Encounter (HOSPITAL_COMMUNITY): Payer: Self-pay

## 2016-01-18 ENCOUNTER — Ambulatory Visit (HOSPITAL_COMMUNITY)
Admission: RE | Admit: 2016-01-18 | Discharge: 2016-01-18 | Disposition: A | Discharge status: Home / Self Care | Payer: Medicare Other | Source: Ambulatory Visit | Attending: Hematology | Admitting: Hematology

## 2016-01-18 DIAGNOSIS — I10 Essential (primary) hypertension: Secondary | ICD-10-CM | POA: Diagnosis not present

## 2016-01-18 DIAGNOSIS — Z85828 Personal history of other malignant neoplasm of skin: Secondary | ICD-10-CM | POA: Diagnosis not present

## 2016-01-18 DIAGNOSIS — K219 Gastro-esophageal reflux disease without esophagitis: Secondary | ICD-10-CM | POA: Insufficient documentation

## 2016-01-18 DIAGNOSIS — Z8249 Family history of ischemic heart disease and other diseases of the circulatory system: Secondary | ICD-10-CM | POA: Insufficient documentation

## 2016-01-18 DIAGNOSIS — R160 Hepatomegaly, not elsewhere classified: Secondary | ICD-10-CM | POA: Insufficient documentation

## 2016-01-18 DIAGNOSIS — D649 Anemia, unspecified: Secondary | ICD-10-CM | POA: Insufficient documentation

## 2016-01-18 DIAGNOSIS — J90 Pleural effusion, not elsewhere classified: Secondary | ICD-10-CM | POA: Insufficient documentation

## 2016-01-18 DIAGNOSIS — M1990 Unspecified osteoarthritis, unspecified site: Secondary | ICD-10-CM | POA: Insufficient documentation

## 2016-01-18 DIAGNOSIS — R05 Cough: Secondary | ICD-10-CM | POA: Insufficient documentation

## 2016-01-18 DIAGNOSIS — K769 Liver disease, unspecified: Secondary | ICD-10-CM | POA: Diagnosis not present

## 2016-01-18 DIAGNOSIS — E785 Hyperlipidemia, unspecified: Secondary | ICD-10-CM | POA: Insufficient documentation

## 2016-01-18 DIAGNOSIS — E119 Type 2 diabetes mellitus without complications: Secondary | ICD-10-CM | POA: Insufficient documentation

## 2016-01-18 DIAGNOSIS — I2510 Atherosclerotic heart disease of native coronary artery without angina pectoris: Secondary | ICD-10-CM | POA: Insufficient documentation

## 2016-01-18 DIAGNOSIS — E039 Hypothyroidism, unspecified: Secondary | ICD-10-CM | POA: Insufficient documentation

## 2016-01-18 DIAGNOSIS — Z87891 Personal history of nicotine dependence: Secondary | ICD-10-CM | POA: Diagnosis not present

## 2016-01-18 DIAGNOSIS — Z7982 Long term (current) use of aspirin: Secondary | ICD-10-CM | POA: Diagnosis not present

## 2016-01-18 DIAGNOSIS — C801 Malignant (primary) neoplasm, unspecified: Secondary | ICD-10-CM

## 2016-01-18 DIAGNOSIS — F329 Major depressive disorder, single episode, unspecified: Secondary | ICD-10-CM | POA: Insufficient documentation

## 2016-01-18 DIAGNOSIS — Z955 Presence of coronary angioplasty implant and graft: Secondary | ICD-10-CM | POA: Diagnosis not present

## 2016-01-18 LAB — COMPREHENSIVE METABOLIC PANEL
ALT: 50 U/L (ref 17–63)
AST: 71 U/L — ABNORMAL HIGH (ref 15–41)
Albumin: 2.8 g/dL — ABNORMAL LOW (ref 3.5–5.0)
Alkaline Phosphatase: 218 U/L — ABNORMAL HIGH (ref 38–126)
Anion gap: 9 (ref 5–15)
BUN: 14 mg/dL (ref 6–20)
CHLORIDE: 99 mmol/L — AB (ref 101–111)
CO2: 27 mmol/L (ref 22–32)
CREATININE: 0.74 mg/dL (ref 0.61–1.24)
Calcium: 8.9 mg/dL (ref 8.9–10.3)
Glucose, Bld: 205 mg/dL — ABNORMAL HIGH (ref 65–99)
Potassium: 3.9 mmol/L (ref 3.5–5.1)
Sodium: 135 mmol/L (ref 135–145)
TOTAL PROTEIN: 7.3 g/dL (ref 6.5–8.1)
Total Bilirubin: 1.3 mg/dL — ABNORMAL HIGH (ref 0.3–1.2)

## 2016-01-18 LAB — GLUCOSE, CAPILLARY
GLUCOSE-CAPILLARY: 188 mg/dL — AB (ref 65–99)
Glucose-Capillary: 188 mg/dL — ABNORMAL HIGH (ref 65–99)

## 2016-01-18 LAB — CBC WITH DIFFERENTIAL/PLATELET
Basophils Absolute: 0 10*3/uL (ref 0.0–0.1)
Basophils Relative: 0 %
EOS PCT: 2 %
Eosinophils Absolute: 0.1 10*3/uL (ref 0.0–0.7)
HCT: 34.3 % — ABNORMAL LOW (ref 39.0–52.0)
Hemoglobin: 11 g/dL — ABNORMAL LOW (ref 13.0–17.0)
LYMPHS ABS: 0.8 10*3/uL (ref 0.7–4.0)
Lymphocytes Relative: 15 %
MCH: 27.9 pg (ref 26.0–34.0)
MCHC: 32.1 g/dL (ref 30.0–36.0)
MCV: 87.1 fL (ref 78.0–100.0)
MONOS PCT: 13 %
Monocytes Absolute: 0.7 10*3/uL (ref 0.1–1.0)
NEUTROS ABS: 3.4 10*3/uL (ref 1.7–7.7)
Neutrophils Relative %: 70 %
PLATELETS: 101 10*3/uL — AB (ref 150–400)
RBC: 3.94 MIL/uL — AB (ref 4.22–5.81)
RDW: 15.6 % — AB (ref 11.5–15.5)
WBC: 5 10*3/uL (ref 4.0–10.5)

## 2016-01-18 LAB — PROTIME-INR
INR: 1.24
PROTHROMBIN TIME: 15.7 s — AB (ref 11.4–15.2)

## 2016-01-18 MED ORDER — MIDAZOLAM HCL 2 MG/2ML IJ SOLN
INTRAMUSCULAR | Status: AC
  Filled 2016-01-18: qty 6

## 2016-01-18 MED ORDER — MIDAZOLAM HCL 2 MG/2ML IJ SOLN
INTRAMUSCULAR | Status: AC | PRN
  Administered 2016-01-18 (×3): 1 mg via INTRAVENOUS

## 2016-01-18 MED ORDER — SODIUM CHLORIDE 0.9 % IV SOLN
INTRAVENOUS | Status: DC
  Administered 2016-01-18: 11:00:00 via INTRAVENOUS

## 2016-01-18 MED ORDER — FENTANYL CITRATE (PF) 100 MCG/2ML IJ SOLN
INTRAMUSCULAR | Status: AC
  Filled 2016-01-18: qty 4

## 2016-01-18 MED ORDER — FENTANYL CITRATE (PF) 100 MCG/2ML IJ SOLN
INTRAMUSCULAR | Status: AC | PRN
  Administered 2016-01-18: 50 ug via INTRAVENOUS

## 2016-01-18 NOTE — H&P (Signed)
Chief Complaint: liver lesions  Referring Physician:Dr. Sullivan Lone  Supervising Physician: Sandi Mariscal  Patient Status:Out-pt  HPI: Randy Macdonald is an 70 y.o. male who recently developed a cough.  He was found to have a large pleural effusion.  He had that drained via thoracentesis.  Since then he has had a persistent dry cough, but also having some abdominal discomfort.  He has felt bloated at times.  On the CT of the chest that noted the effusion, he also had an abnormal lesion in the left lobe of the liver that was concerning.  He was set up for an MRI, which confirmed this as well as a PET scan that revealed multifocal hypermetabolic liver masses with sever osseous mets and hypermetabolic lymph nodes in the porta hepatis as well.  He has seen Dr. Irene Limbo and a request for a liver lesion biopsy has been made.    Past Medical History:  Past Medical History:  Diagnosis Date  . Anemia   . Arthritis    "plenty" (12/22/2015)  . CAD (coronary artery disease)    a. LHC 10/10: pLAD 80, dLAD 70, oD1 50, D2 70, EF 55% >> PCI: 2.5 x 18 mm Endeavor DES to mLAD and 3 x 12 mm Endeavor DES to pLAD   . Depression   . GERD (gastroesophageal reflux disease)   . HTN (hypertension)   . Hyperlipidemia   . Hypothyroidism   . Leukopenia 09/01/2011  . Macrocytosis without anemia 09/01/2011  . Osteoarthritis   . Pleural effusion on right 12/22/2015  . Skin cancer    "right cheek"  . Thrombocytopenia ([place]) 09/01/2011  . Type II diabetes mellitus ([place])     Past Surgical History:  Past Surgical History:  Procedure Laterality Date  . CORONARY ANGIOPLASTY WITH STENT PLACEMENT Bilateral 01/2009   "2 stents"  . HERNIA REPAIR    . SKIN CANCER EXCISION Right    "cheek"  . TONSILLECTOMY  1966  . UMBILICAL HERNIA REPAIR  1990s    Family History:  Family History  Problem Relation Age of Onset  . Thyroid disease Sister   . CAD Brother     Social History:  reports that he quit smoking about 52 years  ago. His smoking use included Cigarettes. He quit after 4.00 years of use. He has never used smokeless tobacco. He reports that he does not drink alcohol or use drugs.  Allergies:  Allergies  Allergen Reactions  . Codeine Nausea And Vomiting  . Sulfa Antibiotics Nausea And Vomiting    Medications: Medications reviewed in Epic  Please HPI for pertinent positives, otherwise complete 10 system ROS negative.  Mallampati Score: MD Evaluation Airway: WNL Heart: WNL Abdomen: WNL Chest/ Lungs: WNL ASA  Classification: 3 Mallampati/Airway Score: Two  Physical Exam: BP (!) 150/65 (BP Location: Right Arm)   Pulse 95   Temp 98.2 F (36.8 C) (Oral)   Resp 16   SpO2 95%  There is no height or weight on file to calculate BMI. General: pleasant, WD, WN, elderly white male who is laying in bed in NAD HEENT: head is normocephalic, atraumatic.  Sclera are noninjected.  PERRL.  Ears and nose without any masses or lesions.  Mouth is pink and moist Heart: regular, rate, and rhythm.  Normal s1,s2. No obvious murmurs, gallops, or rubs noted.  Palpable radial and pedal pulses bilaterally Lungs: CTAB, no wheezes, rhonchi, or rales noted.  Respiratory effort nonlabored Abd: soft, minimally tender in RUQ, ND, +BS,  no masses or organomegaly.  Possible recurrence of his umbilical hernia. Psych: A&Ox3 with an appropriate affect.   Labs: Results for orders placed or performed during the hospital encounter of 01/18/16 (from the past 48 hour(s))  CBC with Differential/Platelet     Status: Abnormal   Collection Time: 01/18/16 11:07 AM  Result Value Ref Range   WBC 5.0 4.0 - 10.5 K/uL   RBC 3.94 (L) 4.22 - 5.81 MIL/uL   Hemoglobin 11.0 (L) 13.0 - 17.0 g/dL   HCT 34.3 (L) 39.0 - 52.0 %   MCV 87.1 78.0 - 100.0 fL   MCH 27.9 26.0 - 34.0 pg   MCHC 32.1 30.0 - 36.0 g/dL   RDW 15.6 (H) 11.5 - 15.5 %   Platelets 101 (L) 150 - 400 K/uL    Comment: SPECIMEN CHECKED FOR CLOTS PLATELET COUNT CONFIRMED BY  SMEAR    Neutrophils Relative % 70 %   Lymphocytes Relative 15 %   Monocytes Relative 13 %   Eosinophils Relative 2 %   Basophils Relative 0 %   Neutro Abs 3.4 1.7 - 7.7 K/uL   Lymphs Abs 0.8 0.7 - 4.0 K/uL   Monocytes Absolute 0.7 0.1 - 1.0 K/uL   Eosinophils Absolute 0.1 0.0 - 0.7 K/uL   Basophils Absolute 0.0 0.0 - 0.1 K/uL  Comprehensive metabolic panel     Status: Abnormal   Collection Time: 01/18/16 11:07 AM  Result Value Ref Range   Sodium 135 135 - 145 mmol/L   Potassium 3.9 3.5 - 5.1 mmol/L   Chloride 99 (L) 101 - 111 mmol/L   CO2 27 22 - 32 mmol/L   Glucose, Bld 205 (H) 65 - 99 mg/dL   BUN 14 6 - 20 mg/dL   Creatinine, Ser 0.74 0.61 - 1.24 mg/dL   Calcium 8.9 8.9 - 10.3 mg/dL   Total Protein 7.3 6.5 - 8.1 g/dL   Albumin 2.8 (L) 3.5 - 5.0 g/dL   AST 71 (H) 15 - 41 U/L   ALT 50 17 - 63 U/L   Alkaline Phosphatase 218 (H) 38 - 126 U/L   Total Bilirubin 1.3 (H) 0.3 - 1.2 mg/dL   GFR calc non Af Amer >60 >60 mL/min   GFR calc Af Amer >60 >60 mL/min    Comment: (NOTE) The eGFR has been calculated using the CKD EPI equation. This calculation has not been validated in all clinical situations. eGFR's persistently <60 mL/min signify possible Chronic Kidney Disease.    Anion gap 9 5 - 15  Protime-INR     Status: Abnormal   Collection Time: 01/18/16 11:07 AM  Result Value Ref Range   Prothrombin Time 15.7 (H) 11.4 - 15.2 seconds   INR 1.24     Imaging: No results found.  Assessment/Plan 1. Liver mass, likely malignancy -we will plan to proceed today with a liver lesion biopsy -labs and vitals have been reviewed -Risks and Benefits discussed with the patient including, but not limited to bleeding, infection, damage to adjacent structures or low yield requiring additional tests. All of the patient's questions were answered, patient is agreeable to proceed. Consent signed and in chart.   Thank you for this interesting consult.  I greatly enjoyed meeting Randy Macdonald  and look forward to participating in their care.  A copy of this report was sent to the requesting provider on this date.  Electronically Signed: Henreitta Cea 01/18/2016, 12:21 PM   I spent a total of  30 Minutes  in face to face in clinical consultation, greater than 50% of which was counseling/coordinating care for liver mass

## 2016-01-18 NOTE — Progress Notes (Signed)
O2 sat 90% on room air. Pt is not in distress or short of breath, but feels like he is unable to take a good, deep breath. States his respiratory status has changed again over the past week "Had improved after thoracentisis for a while" but is worse now.  Notified Saverio Danker Dr. Pascal Lux IR (verbal) and then  Dr Grier Mitts nurse (via voice mail message).  Permission granted for discharge today.  Encouraged Pt to follow up with Dr Irene Limbo and call [phone] for severe shortness of breath or breathing difficulties.

## 2016-01-18 NOTE — Procedures (Signed)
Technically successful US guided biopsy of indeterminate mass within the lateral segment of the left lobe of the liver.   EBL: None No immediate complications.   Ronny Bacon, MD Pager #: [phone]

## 2016-01-18 NOTE — Discharge Instructions (Signed)
Liver Biopsy The liver is a large organ in the upper right-hand side of your abdomen. A liver biopsy is a procedure in which a tissue sample is taken from the liver and examined under a microscope. The procedure is done to confirm a suspected problem. There are three types of liver biopsies:  Percutaneous. In this type, an incision is made in your abdomen. The sample is removed through the incision with a needle.  Laparoscopic. In this type, several incisions are made in the abdomen. A tiny camera is passed through one of the incisions to help guide the health care provider. The sample is removed through the other incision or incisions.  Transjugular. In this type, an incision is made in the neck. A tube is passed through the incision to the liver. The sample is removed through the tube with a needle. LET [facility] CARE PROVIDER KNOW ABOUT:  Any allergies you have.  All medicines you are taking, including vitamins, herbs, eye drops, creams, and over-the-counter medicines.  Previous problems you or members of your family have had with the use of anesthetics.  Any blood disorders you have.  Previous surgeries you have had.  Medical conditions you have.  Possibility of pregnancy, if this applies. RISKS AND COMPLICATIONS Generally, this is a safe procedure. However, problems can occur and include:  Bleeding.  Infection.  Bruising.  Collapsed lung.  Leak of digestive juices (bile) from the liver or gallbladder.  Problems with heart rhythm.  Pain at the biopsy site or in the right shoulder.  Low blood pressure (hypotension).  Injury to nearby organs or tissues. BEFORE THE PROCEDURE  Your health care provider may do some blood or urine tests. These will help your health care provider learn how well your kidneys and liver are working and how well your blood clots.  Ask your health care provider if you will be able to go home the day of the procedure. Arrange for someone to  take you home and stay with you for at least 24 hours.  Do not eat or drink anything after midnight on the night before the procedure or as directed by your health care provider.  Ask your health care provider about:  Changing or stopping your regular medicines. This is especially important if you are taking diabetes medicines or blood thinners.  Taking medicines such as aspirin and ibuprofen. These medicines can thin your blood. Do not take these medicines before your procedure if your health care provider asks you not to. PROCEDURE Regardless of the type of biopsy that will be done, you will have an IV line placed. Through this line, you will receive fluids and medicine to relax you. If you will be having a laparoscopic biopsy, you may also receive medicine through this line to make you sleep during the procedure (general anesthetic). Percutaneous Liver Biopsy  You will positioned on your back, with your right hand over your head.  A health care provider will locate your liver by tapping and pressing on the right side of your abdomen or with the help of an ultrasound machine or CT scan.  An area at the bottom of your last right rib will be numbed.  An incision will be made in the numbed area.  The biopsy needle will be inserted into the incision.  Several samples of liver tissue will be taken with the biopsy needle. You will be asked to hold your breath as each sample is taken. Laparoscopic Liver Biopsy  You will be  positioned on your back. °· Several small incisions will be made in your abdomen. °· Your doctor will pass a tiny camera through one incision. The camera will allow the liver to be viewed on a TV monitor in the operating room. °· Tools will be passed through the other incision or incisions. These tools will be used to remove samples of liver tissue. °Transjugular Liver Biopsy °· You will be positioned on your back on an X-ray table, with your head turned to your left. °· An  area on your neck just over your jugular vein will be numbed. °· An incision will be made in the numbed area. °· A tiny tube will be inserted through the incision. It will be pushed through the jugular vein to a blood vessel in the liver called the hepatic vein. °· Dye will be inserted through the tube, and X-rays will be taken. The dye will make the blood vessels in the liver light up on the X-rays. °· The biopsy needle will be pushed through the tube until it reaches the liver. °· Samples of liver tissue will be taken with the biopsy needle. °· The needle and the tube will be removed. °After the samples are obtained, the incision or incisions will be closed. °AFTER THE PROCEDURE °· You will be taken to a recovery area. °· You may have to lie on your right side for 1-2 hours. This will prevent bleeding from the biopsy site. °· Your progress will be watched. Your blood pressure, pulse, and the biopsy site will be checked often. °· You may have some pain or feel sick. If this happens, tell your health care provider. °· As you begin to feel better, you will be offered ice and beverages. °· You may be allowed to go home when the medicines have worn off and you can walk, drink, eat, and use the bathroom. °  °This information is not intended to replace advice given to you by your health care provider. Make sure you discuss any questions you have with your health care provider. °  °Document Released: 06/17/2003 Document Revised: 04/17/2014 Document Reviewed: 05/23/2013 °Elsevier Interactive Patient Education ©2016 [employer] °Liver Biopsy, Care After °Refer to this sheet in the next few weeks. These instructions provide you with information on caring for yourself after your procedure. Your health care provider may also give you more specific instructions. Your treatment has been planned according to current medical practices, but problems sometimes occur. Call your health care provider if you have any problems or  questions after your procedure. °WHAT TO EXPECT AFTER THE PROCEDURE °After your procedure, it is typical to have the following: °· A small amount of discomfort in the area where the biopsy was done and in the right shoulder or shoulder blade. °· A small amount of bruising around the area where the biopsy was done and on the skin over the liver. °· Sleepiness and fatigue for the rest of the day. °HOME CARE INSTRUCTIONS  °· Rest at home for 1-2 days or as directed by your health care provider. °· Have a friend or family member stay with you for at least 24 hours. °· Because of the medicines used during the procedure, you should not do the following things in the first 24 hours: °¨ Drive. °¨ Use machinery. °¨ Be responsible for the care of other people. °¨ Sign legal documents. °¨ Take a bath or shower. °· There are many different ways to close and cover an incision, including stitches,   skin glue, and adhesive strips. Follow your health care provider's instructions on:  Incision care.  Bandage (dressing) changes and removal. May remove bandaid and shower in 24 hours post procedure.  Incision closure removal.  Do not drink alcohol in the first week.  Do not lift more than 5 pounds or play contact sports for 2 weeks after this test.  Take medicines only as directed by your health care provider. Do not take medicine containing aspirin or non-steroidal anti-inflammatory medicines such as ibuprofen for 1 week after this test.  It is your responsibility to get your test results. SEEK MEDICAL CARE IF:   You have increased bleeding from an incision that results in more than a small spot of blood.  You have redness, swelling, or increasing pain in any incisions.  You notice a discharge or a bad smell coming from any of your incisions.  You have a fever or chills. SEEK IMMEDIATE MEDICAL CARE IF:   You develop swelling, bloating, or pain in your abdomen.  You become dizzy or faint.  You develop a  rash.  You are nauseous or vomit.  You have difficulty breathing, feel short of breath, or feel faint.  You develop chest pain.  You have problems with your speech or vision.  You have trouble balancing or moving your arms or legs.   This information is not intended to replace advice given to you by your health care provider. Make sure you discuss any questions you have with your health care provider.   Document Released: 10/14/2004 Document Revised: 04/17/2014 Document Reviewed: 05/23/2013 Elsevier Interactive Patient Education 2016 Elsevier Inc. Moderate Conscious Sedation, Adult Sedation is the use of medicines to promote relaxation and relieve discomfort and anxiety. Moderate conscious sedation is a type of sedation. Under moderate conscious sedation you are less alert than normal but are still able to respond to instructions or stimulation. Moderate conscious sedation is used during short medical and dental procedures. It is milder than deep sedation or general anesthesia and allows you to return to your regular activities sooner. LET [facility] CARE PROVIDER KNOW ABOUT:   Any allergies you have.  All medicines you are taking, including vitamins, herbs, eye drops, creams, and over-the-counter medicines.  Use of steroids (by mouth or creams).  Previous problems you or members of your family have had with the use of anesthetics.  Any blood disorders you have.  Previous surgeries you have had.  Medical conditions you have.  Possibility of pregnancy, if this applies.  Use of cigarettes, alcohol, or illegal drugs. RISKS AND COMPLICATIONS Generally, this is a safe procedure. However, as with any procedure, problems can occur. Possible problems include:  Oversedation.  Trouble breathing on your own. You may need to have a breathing tube until you are awake and breathing on your own.  Allergic reaction to any of the medicines used for the procedure. BEFORE THE  PROCEDURE  You may have blood tests done. These tests can help show how well your kidneys and liver are working. They can also show how well your blood clots.  A physical exam will be done.  Only take medicines as directed by your health care provider. You may need to stop taking medicines (such as blood thinners, aspirin, or nonsteroidal anti-inflammatory drugs) before the procedure.   Do not eat or drink at least 6 hours before the procedure or as directed by your health care provider.  Arrange for a responsible adult, family member, or friend to take you  home after the procedure. He or she should stay with you for at least 24 hours after the procedure, until the medicine has worn off. PROCEDURE   An intravenous (IV) catheter will be inserted into one of your veins. Medicine will be able to flow directly into your body through this catheter. You may be given medicine through this tube to help prevent pain and help you relax.  The medical or dental procedure will be done. AFTER THE PROCEDURE  You will stay in a recovery area until the medicine has worn off. Your blood pressure and pulse will be checked.   Depending on the procedure you had, you may be allowed to go home when you can tolerate liquids and your pain is under control.   This information is not intended to replace advice given to you by your health care provider. Make sure you discuss any questions you have with your health care provider.   Document Released: 12/20/2000 Document Revised: 04/17/2014 Document Reviewed: 12/02/2012 Elsevier Interactive Patient Education [employer].

## 2016-01-20 ENCOUNTER — Telehealth: Payer: Self-pay | Admitting: Hematology

## 2016-01-20 ENCOUNTER — Ambulatory Visit (HOSPITAL_BASED_OUTPATIENT_CLINIC_OR_DEPARTMENT_OTHER): Payer: Medicare Other | Admitting: Hematology

## 2016-01-20 ENCOUNTER — Encounter: Payer: Self-pay | Admitting: Hematology

## 2016-01-20 VITALS — BP 158/58 | HR 90 | Temp 97.9°F | Resp 18 | Ht 68.0 in | Wt 211.6 lb

## 2016-01-20 DIAGNOSIS — D696 Thrombocytopenia, unspecified: Secondary | ICD-10-CM

## 2016-01-20 DIAGNOSIS — C787 Secondary malignant neoplasm of liver and intrahepatic bile duct: Secondary | ICD-10-CM | POA: Diagnosis not present

## 2016-01-20 DIAGNOSIS — K7460 Unspecified cirrhosis of liver: Secondary | ICD-10-CM

## 2016-01-20 DIAGNOSIS — K769 Liver disease, unspecified: Secondary | ICD-10-CM | POA: Diagnosis not present

## 2016-01-20 DIAGNOSIS — C801 Malignant (primary) neoplasm, unspecified: Secondary | ICD-10-CM

## 2016-01-20 DIAGNOSIS — C7951 Secondary malignant neoplasm of bone: Secondary | ICD-10-CM

## 2016-01-20 DIAGNOSIS — J90 Pleural effusion, not elsewhere classified: Secondary | ICD-10-CM

## 2016-01-20 DIAGNOSIS — C499 Malignant neoplasm of connective and soft tissue, unspecified: Secondary | ICD-10-CM

## 2016-01-20 DIAGNOSIS — G893 Neoplasm related pain (acute) (chronic): Secondary | ICD-10-CM

## 2016-01-20 MED ORDER — LORAZEPAM 0.5 MG PO TABS: 0.5000 mg | ORAL_TABLET | Freq: Every evening | ORAL | 0 refills | Status: AC | PRN

## 2016-01-20 MED ORDER — OXYCODONE HCL 5 MG PO TABS: 5.0000 mg | ORAL_TABLET | ORAL | 0 refills | Status: AC | PRN

## 2016-01-20 MED ORDER — ONDANSETRON HCL 4 MG PO TABS: 4.0000 mg | ORAL_TABLET | Freq: Three times a day (TID) | ORAL | 0 refills | Status: AC | PRN

## 2016-01-20 NOTE — Telephone Encounter (Signed)
Per 10/12 los f/u and chemo will be set up based on final pathology results. No other orders.

## 2016-01-26 ENCOUNTER — Telehealth: Payer: Self-pay | Admitting: *Deleted

## 2016-01-26 NOTE — Telephone Encounter (Signed)
Spouse called asking "When we will get him to [facility].  Result of tests provided and he needs a second opinion per Dr. Irene Limbo.  He's getting weaker.  Return number [phone]."  Aware provider not in [facility] office today but message sent.  Collaborative nurse notified.

## 2016-01-27 ENCOUNTER — Emergency Department (HOSPITAL_COMMUNITY): Payer: Medicare Other

## 2016-01-27 ENCOUNTER — Observation Stay (HOSPITAL_COMMUNITY)
Admission: EM | Admit: 2016-01-27 | Discharge: 2016-01-28 | Disposition: A | Discharge status: Home / Self Care | Payer: Medicare Other | Attending: Internal Medicine | Admitting: Internal Medicine

## 2016-01-27 ENCOUNTER — Encounter (HOSPITAL_COMMUNITY): Payer: Self-pay | Admitting: *Deleted

## 2016-01-27 ENCOUNTER — Encounter (HOSPITAL_COMMUNITY): Payer: Self-pay

## 2016-01-27 ENCOUNTER — Encounter: Payer: Self-pay | Admitting: *Deleted

## 2016-01-27 DIAGNOSIS — R410 Disorientation, unspecified: Secondary | ICD-10-CM | POA: Diagnosis present

## 2016-01-27 DIAGNOSIS — Z85828 Personal history of other malignant neoplasm of skin: Secondary | ICD-10-CM | POA: Insufficient documentation

## 2016-01-27 DIAGNOSIS — Z794 Long term (current) use of insulin: Secondary | ICD-10-CM | POA: Diagnosis not present

## 2016-01-27 DIAGNOSIS — I10 Essential (primary) hypertension: Secondary | ICD-10-CM | POA: Diagnosis not present

## 2016-01-27 DIAGNOSIS — D649 Anemia, unspecified: Secondary | ICD-10-CM | POA: Insufficient documentation

## 2016-01-27 DIAGNOSIS — F329 Major depressive disorder, single episode, unspecified: Secondary | ICD-10-CM | POA: Insufficient documentation

## 2016-01-27 DIAGNOSIS — E669 Obesity, unspecified: Secondary | ICD-10-CM | POA: Diagnosis not present

## 2016-01-27 DIAGNOSIS — Z7982 Long term (current) use of aspirin: Secondary | ICD-10-CM | POA: Diagnosis not present

## 2016-01-27 DIAGNOSIS — E119 Type 2 diabetes mellitus without complications: Secondary | ICD-10-CM | POA: Diagnosis not present

## 2016-01-27 DIAGNOSIS — J90 Pleural effusion, not elsewhere classified: Secondary | ICD-10-CM | POA: Diagnosis present

## 2016-01-27 DIAGNOSIS — J9601 Acute respiratory failure with hypoxia: Secondary | ICD-10-CM | POA: Diagnosis present

## 2016-01-27 DIAGNOSIS — Z79899 Other long term (current) drug therapy: Secondary | ICD-10-CM | POA: Diagnosis not present

## 2016-01-27 DIAGNOSIS — Z87891 Personal history of nicotine dependence: Secondary | ICD-10-CM | POA: Insufficient documentation

## 2016-01-27 DIAGNOSIS — C499 Malignant neoplasm of connective and soft tissue, unspecified: Secondary | ICD-10-CM | POA: Insufficient documentation

## 2016-01-27 DIAGNOSIS — C787 Secondary malignant neoplasm of liver and intrahepatic bile duct: Principal | ICD-10-CM

## 2016-01-27 DIAGNOSIS — E785 Hyperlipidemia, unspecified: Secondary | ICD-10-CM | POA: Diagnosis present

## 2016-01-27 DIAGNOSIS — R4182 Altered mental status, unspecified: Secondary | ICD-10-CM | POA: Diagnosis present

## 2016-01-27 DIAGNOSIS — Z955 Presence of coronary angioplasty implant and graft: Secondary | ICD-10-CM | POA: Diagnosis not present

## 2016-01-27 DIAGNOSIS — D696 Thrombocytopenia, unspecified: Secondary | ICD-10-CM | POA: Diagnosis present

## 2016-01-27 DIAGNOSIS — G9340 Encephalopathy, unspecified: Principal | ICD-10-CM | POA: Diagnosis present

## 2016-01-27 DIAGNOSIS — E039 Hypothyroidism, unspecified: Secondary | ICD-10-CM | POA: Diagnosis present

## 2016-01-27 DIAGNOSIS — C7951 Secondary malignant neoplasm of bone: Secondary | ICD-10-CM | POA: Insufficient documentation

## 2016-01-27 DIAGNOSIS — R160 Hepatomegaly, not elsewhere classified: Secondary | ICD-10-CM | POA: Insufficient documentation

## 2016-01-27 DIAGNOSIS — Z6831 Body mass index (BMI) 31.0-31.9, adult: Secondary | ICD-10-CM | POA: Diagnosis not present

## 2016-01-27 DIAGNOSIS — I2510 Atherosclerotic heart disease of native coronary artery without angina pectoris: Secondary | ICD-10-CM | POA: Diagnosis present

## 2016-01-27 DIAGNOSIS — Z9889 Other specified postprocedural states: Secondary | ICD-10-CM

## 2016-01-27 DIAGNOSIS — R0602 Shortness of breath: Secondary | ICD-10-CM

## 2016-01-27 DIAGNOSIS — R0902 Hypoxemia: Secondary | ICD-10-CM

## 2016-01-27 LAB — COMPREHENSIVE METABOLIC PANEL
ALBUMIN: 2.4 g/dL — AB (ref 3.5–5.0)
ALT: 31 U/L (ref 17–63)
ANION GAP: 10 (ref 5–15)
AST: 39 U/L (ref 15–41)
Alkaline Phosphatase: 193 U/L — ABNORMAL HIGH (ref 38–126)
BUN: 14 mg/dL (ref 6–20)
CO2: 29 mmol/L (ref 22–32)
Calcium: 9.3 mg/dL (ref 8.9–10.3)
Chloride: 94 mmol/L — ABNORMAL LOW (ref 101–111)
Creatinine, Ser: 0.83 mg/dL (ref 0.61–1.24)
GFR calc Af Amer: >60 mL/min (ref >60)
GFR calc non Af Amer: >60 mL/min (ref >60)
GLUCOSE: 387 mg/dL — AB (ref 65–99)
POTASSIUM: 3.9 mmol/L (ref 3.5–5.1)
SODIUM: 133 mmol/L — AB (ref 135–145)
Total Bilirubin: 1 mg/dL (ref 0.3–1.2)
Total Protein: 7.1 g/dL (ref 6.5–8.1)

## 2016-01-27 LAB — URINALYSIS, ROUTINE W REFLEX MICROSCOPIC
Bilirubin Urine: NEGATIVE
Hgb urine dipstick: NEGATIVE
Ketones, ur: 15 mg/dL — AB
LEUKOCYTES UA: NEGATIVE
Nitrite: NEGATIVE
PH: 6 (ref 5.0–8.0)
PROTEIN: NEGATIVE mg/dL
Specific Gravity, Urine: 1.027 (ref 1.005–1.030)

## 2016-01-27 LAB — CBC
HEMATOCRIT: 35.2 % — AB (ref 39.0–52.0)
HEMOGLOBIN: 11.2 g/dL — AB (ref 13.0–17.0)
MCH: 27.7 pg (ref 26.0–34.0)
MCHC: 31.8 g/dL (ref 30.0–36.0)
MCV: 86.9 fL (ref 78.0–100.0)
Platelets: 110 10*3/uL — ABNORMAL LOW (ref 150–400)
RBC: 4.05 MIL/uL — ABNORMAL LOW (ref 4.22–5.81)
RDW: 16.3 % — ABNORMAL HIGH (ref 11.5–15.5)
WBC: 4 10*3/uL (ref 4.0–10.5)

## 2016-01-27 LAB — AMMONIA: AMMONIA: 37 umol/L — AB (ref 9–35)

## 2016-01-27 LAB — URINE MICROSCOPIC-ADD ON: Squamous Epithelial / LPF: NONE SEEN

## 2016-01-27 LAB — CBG MONITORING, ED
GLUCOSE-CAPILLARY: 368 mg/dL — AB (ref 65–99)
Glucose-Capillary: 342 mg/dL — ABNORMAL HIGH (ref 65–99)

## 2016-01-27 NOTE — Progress Notes (Signed)
Patient wife called stating that she is concerned about husband. Randy Macdonald is currently talking in a confused manner and may need fluid removed. Informed patient that MD Irene Limbo is not in the office currently and if she feels as though the patient is in need of emergency services then she needs to take him to the Emergency Room to be evaluated. Patient's wife verbalized understanding.

## 2016-01-27 NOTE — ED Triage Notes (Signed)
Pt sent here by Dr Irene Limbo for altered mental status for several days.  Pt does know month and year, but wife states pt is "talking off the wall".  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

## 2016-01-27 NOTE — Progress Notes (Signed)
Marland Kitchen    HEMATOLOGY/ONCOLOGY CLINIC NOTE  Date of Service: .Marland Kitchen10/03/2016  Patient Care Team: L.Donnie Coffin, MD as PCP - General (Family Medicine) Jacelyn Pi, MD as Consulting Physician (Endocrinology)  CHIEF COMPLAINTS/PURPOSE OF CONSULTATION:  Liver masses  HISTORY OF PRESENTING ILLNESS:   Randy Macdonald is a wonderful 70 y.o. male who has been referred to [place] by Dr .Donnie Coffin, MD for evaluation and management of newly diagnosed left hepatic masses concerning for neoplastic etiology.  Patient has a history of hypertension, dyslipidemia, diabetes, hypothyroidism, coronary artery disease status post PCI in September 2010, GERD, eczema, depression who recently presented to the emergency room on 12/22/2015 with symptoms of progressive fatigue, myalgias, dyspnea on exertion and shortness of breath for a  about a month.  He was noted to have a large right-sided pleural effusion. CT of the chest showed a large right-sided pleural effusion was also incidentally noted to have a lesion on his liver concerning for carcinoma. Patient had a thoracentesis of his right pleural space and removal of a large amount of right-sided pleural effusion with improvement in his shortness of breath. Fluid was noted to be transudative. Cytology showed reactive mesothelial cells with no overt cancer cells. Patient had an echocardiogram that showed normal ejection fraction of [id number] with diastolic dysfunction. Patient had an evaluation of his liver lesion with an MRI of the abdomen on 12/23/2015 which showed a large enhancing lesion in the lateral segment of the left hepatic lobe measuring 10.3 x 6.9 cm. A smaller second lesion was noted in the central left hepatic lobe measuring 1.8 cm. There was peripheral enhancement consistent with malignancy. Portal vein was noted to be patent and he did not have any significant ascites. He was noted to have splenomegaly with venous collaterals suggesting portal hypertension. No  other clear primary was identified in the abdomen.  Patient had an AFP tumor marker which was normal with a level of 1. Hepatitis B surface antigen negative, hepatitis C antibody negative. LDH somewhat elevated at 273.  Patient notes some epigastric abdominal discomfort on palpation. Notes weight loss of about 20-30 pounds in the last several months.  He has had chronic thrombocytopenia from around 2010. Uncertain that this was worked up. Likely related to his splenomegaly.  INTERVAL HISTORY  Patient is here with his family to f/u on the results of his liver biopsy. He notes some minimal shortness of breath and upper abdominal distension but no acute new symptoms since his last visit. We discussed the biopsy results in details and discussed potential treatment approaches and goals of care in details.   MEDICAL HISTORY:  Past Medical History:  Diagnosis Date  . Anemia   . Arthritis    "plenty" (12/22/2015)  . CAD (coronary artery disease)    a. LHC 10/10: pLAD 80, dLAD 70, oD1 50, D2 70, EF 55% >> PCI: 2.5 x 18 mm Endeavor DES to mLAD and 3 x 12 mm Endeavor DES to pLAD   . Depression   . GERD (gastroesophageal reflux disease)   . HTN (hypertension)   . Hyperlipidemia   . Hypothyroidism   . Leukopenia 09/01/2011  . Macrocytosis without anemia 09/01/2011  . Osteoarthritis   . Pleural effusion on right 12/22/2015  . Skin cancer    "right cheek"  . Thrombocytopenia ([facility]) 09/01/2011  . Type II diabetes mellitus ([place])     SURGICAL HISTORY: Past Surgical History:  Procedure Laterality Date  . CORONARY ANGIOPLASTY WITH STENT PLACEMENT Bilateral 01/2009   "2  stents"  . HERNIA REPAIR    . SKIN CANCER EXCISION Right    "cheek"  . TONSILLECTOMY  1966  . UMBILICAL HERNIA REPAIR  1990s    SOCIAL HISTORY: Social History   Social History  . Marital status: Married    Spouse name: N/A  . Number of children: N/A  . Years of education: N/A   Occupational History  . Not on file.    Social History Main Topics  . Smoking status: Former Smoker    Years: 4.00    Types: Cigarettes    Quit date: 06/14/1963  . Smokeless tobacco: Never Used  . Alcohol use No     Comment: "drank a little bit when I was young"  . Drug use: No  . Sexual activity: Not Currently   Other Topics Concern  . Not on file   Social History Narrative  . No narrative on file    FAMILY HISTORY: Family History  Problem Relation Age of Onset  . Thyroid disease Sister   . CAD Brother     ALLERGIES:  is allergic to codeine and sulfa antibiotics.  MEDICATIONS:  Current Outpatient Prescriptions  Medication Sig Dispense Refill  . aspirin 81 MG tablet Take 81 mg by mouth daily.    . citalopram (CELEXA) 20 MG tablet Take 1 tablet (20 mg total) by mouth daily. 30 tablet 0  . CRESTOR 40 MG tablet 40 mg daily.     . diclofenac (VOLTAREN) 75 MG EC tablet Take 75 mg by mouth daily.    . fish oil-omega-3 fatty acids 1000 MG capsule 1,200 mg daily.    . fluorouracil (EFUDEX) 5 % cream Apply topically as needed (Rash----Per patient).     Marland Kitchen JANUMET XR [phone] MG TB24 Take 100-1,000 mg by mouth 2 (two) times daily.    Marland Kitchen levothyroxine (SYNTHROID, LEVOTHROID) 75 MCG tablet Take 75 mcg by mouth daily.    Marland Kitchen LORazepam (ATIVAN) 0.5 MG tablet Take 1-2 tablets (0.5-1 mg total) by mouth at bedtime as needed for sleep. 60 tablet 0  . losartan (COZAAR) 100 MG tablet Take 1 tablet (100 mg total) by mouth daily. 90 tablet 3  . NOVOLIN N RELION 100 UNIT/ML injection Inject 75 Units into the skin daily before breakfast.     . NOVOLIN R RELION 100 UNIT/ML injection Inject 50 Units into the skin 3 (three) times daily before meals.     . ondansetron (ZOFRAN) 4 MG tablet Take 1 tablet (4 mg total) by mouth every 8 (eight) hours as needed for nausea or vomiting. 30 tablet 0  . oxyCODONE (OXY IR/ROXICODONE) 5 MG immediate release tablet Take 1 tablet (5 mg total) by mouth every 4 (four) hours as needed for moderate pain,  severe pain or breakthrough pain. 60 tablet 0  . phytonadione (VITAMIN K) 5 MG tablet Take 1 tablet (5 mg total) by mouth daily. 5 tablet 0  . senna-docusate (SENNA S) 8.6-50 MG tablet Take 2 tablets by mouth at bedtime. 60 tablet 1   No current facility-administered medications for this visit.     REVIEW OF SYSTEMS:    10 Point review of Systems was done is negative except as noted above.  PHYSICAL EXAMINATION: ECOG PERFORMANCE STATUS: 2 - Symptomatic, <50% confined to bed  . Vitals:   01/20/16 1546  BP: (!) 158/58  Pulse: 90  Resp: 18  Temp: 97.9 F (36.6 C)   GENERAL:alert, in no acute distress and comfortable SKIN: skin color, texture, turgor are  normal, no rashes or significant lesions EYES: normal, conjunctiva are pink and non-injected, sclera clear OROPHARYNX:no exudate, no erythema and lips, buccal mucosa, and tongue normal  NECK: supple, no JVD, thyroid normal size, non-tender, without nodularity LYMPH:  no palpable lymphadenopathy in the cervical, axillary or inguinal LUNGS: clear to auscultation with normal respiratory effort HEART: regular rate & rhythm,  no murmurs and no lower extremity edema ABDOMEN: abdomen soft, non-tender, normoactive bowel sounds , mild upper abdominal distension Musculoskeletal: no cyanosis of digits and no clubbing  PSYCH: alert & oriented x 3 with fluent speech NEURO: no focal motor/sensory deficits  LABORATORY DATA:  I have reviewed the data as listed  . CBC Latest Ref Rng & Units 01/18/2016 01/07/2016 12/22/2015  WBC 4.0 - 10.5 K/uL 5.0 4.4 4.4  Hemoglobin 13.0 - 17.0 g/dL 11.0(L) 11.3(L) 11.4(L)  Hematocrit 39.0 - 52.0 % 34.3(L) 35.1(L) 33.1(L)  Platelets 150 - 400 K/uL 101(L) 91(L) 74(L)    . CMP Latest Ref Rng & Units 01/18/2016 01/07/2016 12/23/2015  Glucose 65 - 99 mg/dL 205(H) 272(H) 276(H)  BUN 6 - 20 mg/dL 14 13.5 11  Creatinine 0.61 - 1.24 mg/dL 0.74 0.9 0.85  Sodium 135 - 145 mmol/L 135 138 134(L)  Potassium 3.5 - 5.1  mmol/L 3.9 4.2 3.7  Chloride 101 - 111 mmol/L 99(L) - 102  CO2 22 - 32 mmol/L 27 25 25   Calcium 8.9 - 10.3 mg/dL 8.9 9.1 8.5(L)  Total Protein 6.5 - 8.1 g/dL 7.3 7.2 6.0(L)  Total Bilirubin 0.3 - 1.2 mg/dL 1.3(H) 0.81 1.0  Alkaline Phos 38 - 126 U/L 218(H) 217(H) 128(H)  AST 15 - 41 U/L 71(H) 42(H) 37  ALT 17 - 63 U/L 50 37 32   Component     Latest Ref Rng & Units 12/22/2015 12/23/2015 12/23/2015          6:52 AM  6:22 PM  Iron     45 - 182 ug/dL 22 (L)    TIBC     250 - 450 ug/dL 272    Saturation Ratios     17.9 - 39.5 % 8 (L)    UIBC     ug/dL 250    Creatinine, Urine     mg/dL 78.34    Total Protein, Urine     mg/dL 7    Protein Creatinine Ratio     0.00 - 0.15 mg/mgCre 0.09    Folate, Hemolysate     Not Estab. ng/mL 391.0    HCT     37.5 - 51.0 % 33.1 (L)    Folate, RBC     >498 ng/mL 1,181    Prothrombin Time     11.4 - 15.2 seconds 16.0 (H)    INR      1.27    TSH     0.350 - 4.500 uIU/mL 1.566    APTT     24 - 36 seconds 24    LDH     98 - 192 U/L 273 (H)    Hepatitis B Surface Ag     Negative  Negative   HCV Ab     0.0 - 0.9 s/co ratio <0.1    Vitamin B12     180 - 914 pg/mL 415    Ferritin     24 - 336 ng/mL 155    AFP Tumor Marker     0.0 - 8.3 ng/mL   1.0     RADIOGRAPHIC STUDIES: I have personally reviewed the  radiological images as listed and agreed with the findings in the report. Dg Chest 2 View  Result Date: 12/28/2015 CLINICAL DATA:  Pleural effusion. EXAM: CHEST  2 VIEW COMPARISON:  12/23/2015.  12/22/2015.  CT 12/22/2015 . FINDINGS: Mediastinum and hilar structures normal. Mild stable cardiomegaly. Mild right base atelectasis and or infiltrate cannot be excluded. Small right pleural effusion cannot be excluded. Heart size normal . IMPRESSION: 1. Mild right base atelectasis and or infiltrate cannot be excluded. Small right pleural effusion cannot be excluded. Findings have decreased significantly from prior plain films CT of 12/22/2015. 2.  Mild stable cardiomegaly. Electronically Signed   By: Marcello Moores  Register   On: 12/28/2015 13:05   Nm Pet Image Initial (pi) Skull Base To Thigh  Result Date: 01/12/2016 CLINICAL DATA:  Initial treatment strategy for left liver mass on CT and MRI. No given history of malignancy. Right thoracentesis 12/23/2015 demonstrated reactive mesothelial cells. EXAM: NUCLEAR MEDICINE PET SKULL BASE TO THIGH TECHNIQUE: 9.51 mCi F-18 FDG was injected intravenously. Full-ring PET imaging was performed from the skull base to thigh after the radiotracer. CT data was obtained and used for attenuation correction and anatomic localization. FASTING BLOOD GLUCOSE:  Value: 127 mg/dl COMPARISON:  CT 12/22/2015 and MRI 12/23/2015. FINDINGS: NECK No hypermetabolic cervical lymph nodes are identified.There are no lesions of the pharyngeal mucosal space. CHEST There are no hypermetabolic mediastinal, hilar or axillary lymph nodes. There is a recurrent moderate-sized dependent right pleural effusion without associated abnormal metabolic activity. There is partial right lower and middle lobe collapse. No suspicious pulmonary activity is seen. However, there are several small pulmonary nodules, measuring up to 5 mm in the lingula on image 29, suspicious for possible metastatic disease. Coronary artery atherosclerosis noted. ABDOMEN/PELVIS The dominant mass replacing most of the lateral segment left hepatic lobe is hypermetabolic. This demonstrates central necrosis and an SUV max of 22.7. This corresponds with an approximately 10.7 x 6.9 cm mass on the CT images. There is an adjacent hypermetabolic 2.8 x 2.0 cm lesion in the medial segment of the left hepatic lobe on image 105. In addition, there is a hypermetabolic lesion in the dome of the right hepatic lobe, measuring 1.7 cm on image 100 and (SUV max 10.9). 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 No hypermetabolic activity is seen within the pancreas, spleen or adrenal glands. Cystic  lesion is again noted within the pancreatic body. There are hypermetabolic lymph nodes within the porta hepatis. There is a small amount of ascites without associated hypermetabolic activity. Calcified gallstones, a small nonobstructing calculus in the upper pole of the right kidney and a ventral umbilical hernia containing fat are noted. SKELETON There are numerous hypermetabolic osseous lesions throughout the axial and appendicular skeleton consistent with metastatic disease. The individual lesions are not well seen on the CT images, demonstrating no significant lysis or sclerosis. There is no pathologic fracture. Lesions are noted within both humeral diaphyses, the right mandibular body, throughout the thoracolumbar spine (large lesion on the right at T9 extending into the posterior elements has an SUV max of 14.9), throughout the bony pelvis, including the sacrum and both proximal femurs. There is a large lesion involving the intertrochanteric region of the right femoral neck demonstrating an SUV max of 12.6. IMPRESSION: 1. Multifocal hypermetabolic liver masses with dominant centrally necrotic lesion in the lateral segment of the left lobe. 2. Multiple hypermetabolic osseous metastases. The individual lesions are not well seen on the CT images. No evidence of pathologic fracture or epidural  tumor. 3. Hypermetabolic lymph nodes in the porta hepatis consistent with metastatic disease. 4. Small pulmonary nodules bilaterally, too small to characterize by PET-CT. No hypermetabolic activity is seen within recurrent right pleural effusion or a small amount of ascites. 5. The osseous metastatic disease is somewhat atypical for hepatocellular carcinoma, and the patient's serum AFP level is not elevated. No other primary malignancy identified. Hepatic tissue sampling recommended. Electronically Signed   By: Richardean Sale M.D.   On: 01/12/2016 12:26   US Biopsy  Result Date: 01/18/2016 INDICATION: No known primary,  now with infiltrative hypermetabolic mass involving the lateral segment of left lobe of the liver. Request made for ultrasound-guided liver mass biopsy for tissue diagnostic purposes. EXAM: ULTRASOUND GUIDED LIVER LESION BIOPSY COMPARISON:  PET-CT - 01/12/2016; abdominal MRI - 12/23/2015 MEDICATIONS: None ANESTHESIA/SEDATION: Fentanyl 50 mcg IV; Versed 3 mg IV Total Moderate Sedation time: 13 minutes. The patient's level of consciousness and vital signs were monitored continuously by radiology nursing throughout the procedure under my direct supervision. COMPLICATIONS: None immediate. PROCEDURE: Informed written consent was obtained from the patient after a discussion of the risks, benefits and alternatives to treatment. The patient understands and consents the procedure. A timeout was performed prior to the initiation of the procedure. Ultrasound scanning was performed of the right upper abdominal quadrant demonstrates mixed echogenic at least 11.7 x 6.7 cm solid mass within the lateral segment of left lobe of the liver (image 4) compatible with the findings seen on preceding PET-CT and abdominal MRI. The procedure was planned. The midline of the upper abdomen was prepped and draped in the usual sterile fashion. The overlying soft tissues were anesthetized with 1% lidocaine with epinephrine. A 17 gauge, 6.8 cm co-axial needle was advanced into a peripheral aspect of the lesion. This was followed by 5 core biopsies with an 18 gauge core device under direct ultrasound guidance. The coaxial needle tract was embolized with a small amount of Gel-Foam slurry and superficial hemostasis was obtained with manual compression. Post procedural scanning was negative for definitive area of hemorrhage or additional complication. A dressing was placed. The patient tolerated the procedure well without immediate post procedural complication. IMPRESSION: Technically successful ultrasound guided core needle biopsy of infiltrative mass  within the lateral segment of the left lobe of the liver. Electronically Signed   By: Sandi Mariscal M.D.   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  #2 Chronic liver disease/Liver Cirrhosis  with evidence of portal hypertension and splenomegaly. Hepatitis B and C negative. 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  4) recent symptomatic right pleural effusion  -likely related to liver disease . He was transudative and cytology showed only reactive mesothelial cells and no malignant cells .  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 -I discussed the concerning findings in details with the patient and his family and explained that this is an incurable condition and that treatment is going to be quite challenging given his underlying liver disease and extensive liver involvement. -we discussed best supportive cares through hospice vs palliative chemotherapy  -he was given a referral to [facility] sarcoma team for a second opinion -he chooses to start palliative treatment for his metastatic STS -- we  planned to start him on Doxorubicin (dose reduced for bilirubin) + Olaratumab. Marchelle Folks for bone mets.  5) . Patient Active Problem List   Diagnosis Date Noted  . Liver lesion 12/24/2015  . Pleural effusion on right 12/22/2015  . Pleural effusion, right 12/22/2015  . Obesity 01/28/2013  . Essential hypertension, benign 01/28/2013  . Hyperlipidemia   . Coronary atherosclerosis of native coronary artery   . Hypothyroidism   . Diabetes ([place])   . GERD (gastroesophageal reflux disease)   . Leukopenia 09/01/2011  . Thrombocytopenia ([place]) 09/01/2011  . Macrocytosis without anemia 09/01/2011   Plan -Continue follow-up with primary care physician for management of other chronic medical comorbidities . -Might need to consider pros and cons of continuing aspirin if platelet counts drop below 50,000 .   All of the patients, his wife's and daughters  questions were answerein details to their apparent satisfaction. The patient knows to call the clinic with any problems, questions or concerns.  I spent 35 minutes counseling the patient face to face. The total time spent in the appointment was 40 minutes and more than 50% was on counseling and direct patient cares.    Sullivan Lone MD [place] AAHIVMS [facility] Hematology/Oncology Physician [facility]  (Office):       [phone] (Work cell):  [phone] (Fax):           [phone]

## 2016-01-27 NOTE — ED Notes (Signed)
Care handoff to [place], [place]

## 2016-01-27 NOTE — ED Notes (Signed)
Ammonia ordered per PA Janit Bern

## 2016-01-27 NOTE — ED Provider Notes (Signed)
[facility] DEPT Provider Note   CSN: [id number] Arrival date & time: 01/27/16  1752  By signing my name below, I, Randy Macdonald, attest that this documentation has been prepared under the direction and in the presence of Ripley Fraise, MD. Electronically Signed: Soijett Macdonald, ED Scribe. 01/27/16. 11:27 PM.   History   Chief Complaint Chief Complaint  Patient presents with  . Altered Mental Status    LEVEL 5 CAVEAT: ALTERED MENTAL STATUS  HPI  Randy Macdonald is a 70 y.o. male with a PMHx of liver CA, bone CA, who presents to the Emergency Department complaining of AMS onset 2 days. Wife states that she called Dr. Irene Limbo earlier due to the pt speaking "off the wall" and Dr. Irene Limbo informed him to come into the ED for further evaluation. Family denies the pt having any recent falls or injuries, but she notes that the pt began to lose his balance earlier and she caught him prior to him falling. Wife notes that the pt recently began taking ativan for sleep and also pain meds. Pt wife is unsure if the pt current symptoms are due to the recent medications. Family notes that the pt is having associated symptoms of confusion, appetite change, abdominal pain, hip pain, CP, and HA. Family states that the pt has tried ativan for sleep and oxycodone for the relief of his symptoms. He denies fever, vomiting, dizziness, and any other symptoms.   The history is provided by the spouse and a relative. No language interpreter was used.  Altered Mental Status   This is a new problem. The current episode started 2 days ago. The problem has not changed since onset.Associated symptoms comments: confusion. Risk factors include a change in prescription (recent ativan and oxycodone Rx). His past medical history is significant for diabetes and hypertension. Past medical history comments: Liver CA, bone CA.    Past Medical History:  Diagnosis Date  . Anemia   . Arthritis    "plenty" (12/22/2015)  . CAD (coronary  artery disease)    a. LHC 10/10: pLAD 80, dLAD 70, oD1 50, D2 70, EF 55% >> PCI: 2.5 x 18 mm Endeavor DES to mLAD and 3 x 12 mm Endeavor DES to pLAD   . Depression   . GERD (gastroesophageal reflux disease)   . HTN (hypertension)   . Hyperlipidemia   . Hypothyroidism   . Leukopenia 09/01/2011  . Macrocytosis without anemia 09/01/2011  . Osteoarthritis   . Pleural effusion on right 12/22/2015  . Skin cancer    "right cheek", liver ca, bone ca  . Thrombocytopenia ([place]) 09/01/2011  . Type II diabetes mellitus [facility])     Patient Active Problem List   Diagnosis Date Noted  . 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 Bone metastases ([place]) 01/27/2016  . Liver lesion 12/24/2015  . Pleural effusion on right 12/22/2015  . Pleural effusion, right 12/22/2015  . Obesity 01/28/2013  . Essential hypertension, benign 01/28/2013  . Hyperlipidemia   . Coronary atherosclerosis of native coronary artery   . Hypothyroidism   . Diabetes ([place])   . GERD (gastroesophageal reflux disease)   . Leukopenia 09/01/2011  . Thrombocytopenia ([place]) 09/01/2011  . Macrocytosis without anemia 09/01/2011    Past Surgical History:  Procedure Laterality Date  . CORONARY ANGIOPLASTY WITH STENT PLACEMENT Bilateral 01/2009   "2 stents"  . HERNIA REPAIR    . SKIN CANCER EXCISION Right    "cheek"  . TONSILLECTOMY  1966  . UMBILICAL  HERNIA REPAIR  1990s       Home Medications    Prior to Admission medications   Medication Sig Start Date End Date Taking? Authorizing Provider  aspirin 81 MG tablet Take 81 mg by mouth daily.    Historical Provider, MD  citalopram (CELEXA) 20 MG tablet Take 1 tablet (20 mg total) by mouth daily. 12/24/15   Mariel Aloe, MD  CRESTOR 40 MG tablet 40 mg daily.  07/21/11   Historical Provider, MD  diclofenac (VOLTAREN) 75 MG EC tablet Take 75 mg by mouth daily.    Historical Provider, MD  fish oil-omega-3 fatty acids 1000 MG capsule 1,200 mg daily.    Historical Provider, MD    fluorouracil (EFUDEX) 5 % cream Apply topically as needed (Rash----Per patient).  01/27/14   Historical Provider, MD  JANUMET XR [phone] MG TB24 Take 100-1,000 mg by mouth 2 (two) times daily. 06/24/15   Historical Provider, MD  levothyroxine (SYNTHROID, LEVOTHROID) 75 MCG tablet Take 75 mcg by mouth daily.    Historical Provider, MD  LORazepam (ATIVAN) 0.5 MG tablet Take 1-2 tablets (0.5-1 mg total) by mouth at bedtime as needed for sleep. 01/20/16   Brunetta Genera, MD  losartan (COZAAR) 100 MG tablet Take 1 tablet (100 mg total) by mouth daily. 09/30/15   Liliane Shi, PA-C  NOVOLIN N RELION 100 UNIT/ML injection Inject 75 Units into the skin daily before breakfast.  08/07/11   Historical Provider, MD  NOVOLIN R RELION 100 UNIT/ML injection Inject 50 Units into the skin 3 (three) times daily before meals.  08/06/11   Historical Provider, MD  ondansetron (ZOFRAN) 4 MG tablet Take 1 tablet (4 mg total) by mouth every 8 (eight) hours as needed for nausea or vomiting. 01/20/16   Brunetta Genera, MD  oxyCODONE (OXY IR/ROXICODONE) 5 MG immediate release tablet Take 1 tablet (5 mg total) by mouth every 4 (four) hours as needed for moderate pain, severe pain or breakthrough pain. 01/20/16   Brunetta Genera, MD  phytonadione (VITAMIN K) 5 MG tablet Take 1 tablet (5 mg total) by mouth daily. 01/03/16   Brunetta Genera, MD  senna-docusate (SENNA S) 8.6-50 MG tablet Take 2 tablets by mouth at bedtime. 01/03/16   Brunetta Genera, MD    Family History Family History  Problem Relation Age of Onset  . Thyroid disease Sister   . CAD Brother     Social History Social History  Substance Use Topics  . Smoking status: Former Smoker    Years: 4.00    Types: Cigarettes    Quit date: 06/14/1963  . Smokeless tobacco: Never Used  . Alcohol use No     Comment: "drank a little bit when I was young"     Allergies   Codeine and Sulfa antibiotics   Review of Systems Review of Systems   Unable to perform ROS: Mental status change     Physical Exam Updated Vital Signs BP 131/61 (BP Location: Right Arm)   Pulse 92   Temp 97.4 F (36.3 C) (Oral)   Resp 10   Ht 5\' 8"  (1.727 m)   Wt 206 lb (93.4 kg)   SpO2 93%   BMI 31.32 kg/m   Physical Exam CONSTITUTIONAL: elderly, disheveled.  HEAD: Normocephalic/atraumatic EYES: EOMI/PERRL ENMT: Mucous membranes dry  NECK: supple no meningeal signs SPINE/BACK:entire spine nontender CV: S1/S2 noted, no murmurs/rubs/gallops noted LUNGS: decreased BS noted in right base, no distress noted, mild hypoxia noted ABDOMEN:  soft, nontender, no rebound or guarding, bowel sounds noted throughout abdomen GU:no cva tenderness NEURO: Pt is awake/alert/confused, moves all extremitiesx4.  No facial droop.  Pt has tangential thought process EXTREMITIES: pulses normal/equal, full ROM SKIN: warm, color normal PSYCH: pt confused.    ED Treatments / Results  DIAGNOSTIC STUDIES: Oxygen Saturation is 93% on RA, low by my interpretation.    COORDINATION OF CARE: 11:24 PM Discussed treatment plan with pt family at bedside which includes labs, CT head, CXR, UA, and pt family agreed to plan.    Labs (all labs ordered are listed, but only abnormal results are displayed) Labs Reviewed  COMPREHENSIVE METABOLIC PANEL - Abnormal; Notable for the following:       Result Value   Sodium 133 (*)    Chloride 94 (*)    Glucose, Bld 387 (*)    Albumin 2.4 (*)    Alkaline Phosphatase 193 (*)    All other components within normal limits  CBC - Abnormal; Notable for the following:    RBC 4.05 (*)    Hemoglobin 11.2 (*)    HCT 35.2 (*)    RDW 16.3 (*)    Platelets 110 (*)    All other components within normal limits  AMMONIA - Abnormal; Notable for the following:    Ammonia 37 (*)    All other components within normal limits  URINALYSIS, ROUTINE W REFLEX MICROSCOPIC (NOT AT [facility]) - Abnormal; Notable for the following:    Glucose, UA >1000 (*)     Ketones, ur 15 (*)    All other components within normal limits  URINE MICROSCOPIC-ADD ON - Abnormal; Notable for the following:    Bacteria, UA RARE (*)    Casts HYALINE CASTS (*)    All other components within normal limits  CBG MONITORING, ED - Abnormal; Notable for the following:    Glucose-Capillary 368 (*)    All other components within normal limits  CBG MONITORING, ED - Abnormal; Notable for the following:    Glucose-Capillary 342 (*)    All other components within normal limits    EKG  EKG Interpretation  Date/Time:  Friday January 28 2016 01:06:03 EDT Ventricular Rate:  88 PR Interval:    QRS Duration: 97 QT Interval:  396 QTC Calculation: 480 R Axis:   51 Text Interpretation:  Sinus rhythm Abnormal R-wave progression, early transition Borderline T wave abnormalities Borderline prolonged QT interval Baseline wander in lead(s) V2 V3 V4 No significant change since last tracing Confirmed by Christy Gentles  MD, [place] ([id number]) on 01/28/2016 1:11:00 AM       Radiology Dg Chest 2 View  Result Date: 01/28/2016 CLINICAL DATA:  70 year old male with confusion EXAM: CHEST  2 VIEW COMPARISON:  Chest radiograph dated 12/28/2015 FINDINGS: There is consolidative changes of the right lung base likely combination of eventration of the diaphragm, small pleural effusion, and atelectasis versus infiltrate. The left lung is clear. There is no pneumothorax. The cardiac silhouette is within normal limits with no acute osseous pathology. IMPRESSION: Right lung base consolidative changes likely combination of small pleural effusion and atelectasis/ infiltrate. Underlying mass is not excluded. Clinical correlation and follow-up resolution recommended. Electronically Signed   By: Anner Crete M.D.   On: 01/28/2016 00:16   Ct Head Wo Contrast  Result Date: 01/28/2016 CLINICAL DATA:  Altered mentation. History of liver, bone and skin cancer. EXAM: CT HEAD WITHOUT CONTRAST TECHNIQUE: Contiguous  axial images were obtained from the base of the skull through  the vertex without intravenous contrast. COMPARISON:  None. FINDINGS: BRAIN: The ventricles and sulci are normal for age. No intraparenchymal hemorrhage, mass effect nor midline shift. Patchy supratentorial white matter hypodensities within normal range for patient's age, though non-specific are most compatible with chronic small vessel ischemic disease. No acute large vascular territory infarcts. No abnormal extra-axial fluid collections. Basal cisterns are patent. VASCULAR: Moderate calcific atherosclerosis of the carotid siphons. SKULL: No skull fracture. No significant scalp soft tissue swelling. SINUSES/ORBITS: The mastoid air-cells and included paranasal sinuses are well-aerated.The included ocular globes and orbital contents are non-suspicious. OTHER: None. IMPRESSION: Cerebral atrophy with small vessel ischemic disease of periventricular white matter. No apparent intra-axial mass, edema or midline shift. No hemorrhage. Electronically Signed   By: Ashley Royalty M.D.   On: 01/28/2016 00:43    Procedures Procedures (including critical care time)  Medications Ordered in ED Medications - No data to display   Initial Impression / Assessment and Plan / ED Course  I have reviewed the triage vital signs and the nursing notes.  Pertinent labs & imaging results that were available during my care of the patient were reviewed by me and considered in my medical decision making (see chart for details).  Clinical Course    Pt here with delirium, likely due to ativan/pain meds Also - pt with recurrent pleural effusion with hypoxia on ambulation I feel he will require admission for thoracentesis and monitoring Pt/family agreeable D/w dr Blaine Hamper for admission Pt has known liver CA per records  Final Clinical Impressions(s) / ED Diagnoses   Final diagnoses:  Delirium  Pleural effusion  Hypoxia    New Prescriptions New Prescriptions   No  medications on file    I personally performed the services described in this documentation, which was scribed in my presence. The recorded information has been reviewed and is accurate.        Ripley Fraise, MD 01/28/16 [phone]

## 2016-01-28 ENCOUNTER — Observation Stay (HOSPITAL_COMMUNITY): Payer: Medicare Other

## 2016-01-28 DIAGNOSIS — G9340 Encephalopathy, unspecified: Secondary | ICD-10-CM

## 2016-01-28 DIAGNOSIS — K219 Gastro-esophageal reflux disease without esophagitis: Secondary | ICD-10-CM

## 2016-01-28 DIAGNOSIS — I2510 Atherosclerotic heart disease of native coronary artery without angina pectoris: Secondary | ICD-10-CM

## 2016-01-28 DIAGNOSIS — I10 Essential (primary) hypertension: Secondary | ICD-10-CM

## 2016-01-28 DIAGNOSIS — D696 Thrombocytopenia, unspecified: Secondary | ICD-10-CM

## 2016-01-28 DIAGNOSIS — J9601 Acute respiratory failure with hypoxia: Secondary | ICD-10-CM | POA: Diagnosis present

## 2016-01-28 DIAGNOSIS — R410 Disorientation, unspecified: Secondary | ICD-10-CM | POA: Diagnosis present

## 2016-01-28 DIAGNOSIS — J90 Pleural effusion, not elsewhere classified: Secondary | ICD-10-CM

## 2016-01-28 DIAGNOSIS — E039 Hypothyroidism, unspecified: Secondary | ICD-10-CM

## 2016-01-28 DIAGNOSIS — E785 Hyperlipidemia, unspecified: Secondary | ICD-10-CM

## 2016-01-28 LAB — LIPID PANEL
CHOL/HDL RATIO: 8.2 ratio
CHOLESTEROL: 139 mg/dL (ref 0–200)
HDL: 17 mg/dL — AB (ref >40)
LDL Cholesterol: 102 mg/dL — ABNORMAL HIGH (ref 0–99)
TRIGLYCERIDES: 98 mg/dL (ref <150)
VLDL: 20 mg/dL (ref 0–40)

## 2016-01-28 LAB — APTT: aPTT: 34 seconds (ref 24–36)

## 2016-01-28 LAB — BASIC METABOLIC PANEL
ANION GAP: 10 (ref 5–15)
BUN: 13 mg/dL (ref 6–20)
CHLORIDE: 95 mmol/L — AB (ref 101–111)
CO2: 29 mmol/L (ref 22–32)
Calcium: 9 mg/dL (ref 8.9–10.3)
Creatinine, Ser: 0.77 mg/dL (ref 0.61–1.24)
GFR calc Af Amer: >60 mL/min (ref >60)
GLUCOSE: 352 mg/dL — AB (ref 65–99)
POTASSIUM: 3.8 mmol/L (ref 3.5–5.1)
Sodium: 134 mmol/L — ABNORMAL LOW (ref 135–145)

## 2016-01-28 LAB — CBC
HEMATOCRIT: 32.5 % — AB (ref 39.0–52.0)
HEMOGLOBIN: 10.3 g/dL — AB (ref 13.0–17.0)
MCH: 27.5 pg (ref 26.0–34.0)
MCHC: 31.7 g/dL (ref 30.0–36.0)
MCV: 86.7 fL (ref 78.0–100.0)
Platelets: 85 10*3/uL — ABNORMAL LOW (ref 150–400)
RBC: 3.75 MIL/uL — ABNORMAL LOW (ref 4.22–5.81)
RDW: 16.3 % — ABNORMAL HIGH (ref 11.5–15.5)
WBC: 3.8 10*3/uL — ABNORMAL LOW (ref 4.0–10.5)

## 2016-01-28 LAB — GLUCOSE, CAPILLARY
GLUCOSE-CAPILLARY: 291 mg/dL — AB (ref 65–99)
Glucose-Capillary: 354 mg/dL — ABNORMAL HIGH (ref 65–99)
Glucose-Capillary: 263 mg/dL — ABNORMAL HIGH (ref 65–99)

## 2016-01-28 LAB — PROTIME-INR
INR: 1.31
Prothrombin Time: 16.4 seconds — ABNORMAL HIGH (ref 11.4–15.2)

## 2016-01-28 MED ORDER — OXYCODONE HCL 5 MG PO TABS: 5.0000 mg | ORAL_TABLET | ORAL | Status: DC | PRN

## 2016-01-28 MED ORDER — LACTULOSE 10 GM/15ML PO SOLN
20.0000 g | Freq: Two times a day (BID) | ORAL | Status: DC
Start: 2016-01-28 — End: 2016-01-28
  Administered 2016-01-28: 20 g via ORAL
  Filled 2016-01-28 (×2): qty 30

## 2016-01-28 MED ORDER — CITALOPRAM HYDROBROMIDE 10 MG PO TABS
20.0000 mg | ORAL_TABLET | Freq: Every day | ORAL | Status: DC
  Administered 2016-01-28: 20 mg via ORAL
  Filled 2016-01-28: qty 2

## 2016-01-28 MED ORDER — ONDANSETRON HCL 4 MG PO TABS: 4.0000 mg | ORAL_TABLET | Freq: Four times a day (QID) | ORAL | Status: DC | PRN

## 2016-01-28 MED ORDER — INSULIN ASPART 100 UNIT/ML ~~LOC~~ SOLN
0.0000 [IU] | Freq: Three times a day (TID) | SUBCUTANEOUS | Status: DC
  Administered 2016-01-28 (×2): 5 [IU] via SUBCUTANEOUS
  Administered 2016-01-28: 9 [IU] via SUBCUTANEOUS

## 2016-01-28 MED ORDER — DM-GUAIFENESIN ER 30-600 MG PO TB12
1.0000 | ORAL_TABLET | Freq: Two times a day (BID) | ORAL | Status: DC
  Administered 2016-01-28 (×2): 1 via ORAL
  Filled 2016-01-28 (×2): qty 1

## 2016-01-28 MED ORDER — LEVOTHYROXINE SODIUM 75 MCG PO TABS
75.0000 ug | ORAL_TABLET | Freq: Every day | ORAL | Status: DC
  Administered 2016-01-28: 75 ug via ORAL
  Filled 2016-01-28: qty 1

## 2016-01-28 MED ORDER — ALBUTEROL SULFATE (2.5 MG/3ML) 0.083% IN NEBU: 2.5000 mg | INHALATION_SOLUTION | Freq: Four times a day (QID) | RESPIRATORY_TRACT | Status: DC | PRN

## 2016-01-28 MED ORDER — ROSUVASTATIN CALCIUM 10 MG PO TABS
40.0000 mg | ORAL_TABLET | Freq: Every day | ORAL | Status: DC
  Administered 2016-01-28: 40 mg via ORAL
  Filled 2016-01-28: qty 4

## 2016-01-28 MED ORDER — INSULIN ASPART 100 UNIT/ML ~~LOC~~ SOLN: 0.0000 [IU] | Freq: Every day | SUBCUTANEOUS | Status: DC

## 2016-01-28 MED ORDER — LIDOCAINE HCL (PF) 1 % IJ SOLN
INTRAMUSCULAR | Status: AC
  Filled 2016-01-28: qty 10

## 2016-01-28 MED ORDER — LORAZEPAM 1 MG PO TABS: 0.5000 mg | ORAL_TABLET | Freq: Every evening | ORAL | Status: DC | PRN

## 2016-01-28 MED ORDER — ZOLPIDEM TARTRATE 5 MG PO TABS: 5.0000 mg | ORAL_TABLET | Freq: Every evening | ORAL | Status: DC | PRN

## 2016-01-28 MED ORDER — LOSARTAN POTASSIUM 50 MG PO TABS
100.0000 mg | ORAL_TABLET | Freq: Every day | ORAL | Status: DC
  Administered 2016-01-28: 100 mg via ORAL
  Filled 2016-01-28 (×2): qty 2

## 2016-01-28 MED ORDER — ACETAMINOPHEN 650 MG RE SUPP: 650.0000 mg | Freq: Four times a day (QID) | RECTAL | Status: DC | PRN

## 2016-01-28 MED ORDER — ACETAMINOPHEN 325 MG PO TABS: 650.0000 mg | ORAL_TABLET | Freq: Four times a day (QID) | ORAL | Status: DC | PRN

## 2016-01-28 MED ORDER — ONDANSETRON HCL 4 MG/2ML IJ SOLN: 4.0000 mg | Freq: Four times a day (QID) | INTRAMUSCULAR | Status: DC | PRN

## 2016-01-28 NOTE — ED Notes (Signed)
Patient ambulated with minimal assistance. Tolerated fair. Slight SOB, O2 sat dropped from 92% upon standing to 86% upon ambulation.

## 2016-01-28 NOTE — Procedures (Signed)
Ultrasound-guided  therapeutic right  thoracentesis performed yielding 1.6 liters of slightly hazy, amber colored fluid. No immediate complications. Follow-up chest x-ray pending.

## 2016-01-28 NOTE — Progress Notes (Signed)
Inpatient Diabetes Program Recommendations  AACE/ADA: New Consensus Statement on Inpatient Glycemic Control (2015)  Target Ranges:  Prepandial:   less than 140 mg/dL      Peak postprandial:   less than 180 mg/dL (1-2 hours)      Critically ill patients:  140 - 180 mg/dL   Lab Results  Component Value Date   GLUCAP 354 (H) 01/28/2016   HGBA1C 8.3 (H) 12/22/2015    Review of Glycemic Control  Diabetes history: DM 2 Outpatient Diabetes medications: NPH 75 units Daily, Novolin R 50 units TID meal coverage, Janumet [phone] mg BID Current orders for Inpatient glycemic control: Novolog Sensitive + HS scale  Inpatient Diabetes Program Recommendations: Insulin - Basal: Patient takes NPH 75 units Daily at home, Please consider starting NPH 35 units Daily while inpatient. Insulin - Meal Coverage: Patient takes Novolin R 50 units TID with meals at home. Please consider Novolog 5 units TID with meals.  Thanks,  Tama Headings RN, MSN, [facility] Inpatient Diabetes Coordinator Team Pager [phone] (8a-5p)

## 2016-01-28 NOTE — Progress Notes (Signed)
PROGRESS NOTE    Randy Macdonald  [id number] DOB: [birth year] DOA: 01/27/2016 PCP: Donnie Coffin, MD   Chief Complaint  Patient presents with  . Altered Mental Status    Brief Narrative:  HPI on 01/28/2016 by Dr. Ivor Costa Randy Macdonald is a 70 y.o. male with medical history significant of for hypertension, hyperlipidemia, diabetes mellitus, GERD, hypothyroidism, depression, chronic thrombocytopenia, right pleural effusion, anemia, CAD, s/p of stent placement, hepatic mass (pending biopsy, followed by Dr. Irene Limbo), who presents with shortness of breath and altered mental status.   Per report, pt was confused in the past several days. pt is "talking off the wall" per his wife. He moves all extremities, no unilateral weakness, numbness or tingling sensations. No recent interval hearing loss. When I saw pt in ED, he is mildy confused, but oriented 3. He has all questions reasonably. Pt states that he has a shortness of breath and dry cough. No fever or chills. Patient states that he has mild chest pain sometimes, but no chest pain currently. He has mild diffused AP, but denies nausea, vomiting, diarrhea, symptoms of UTI. Assessment & Plan   Acute respiratory failure with supposed hypoxia -No documentation noted O2 sats <88% -Patient is currently on room air, oxygen saturations well into the 90s. -Patient currently denies any shortness of breath  Recurrent Right pleural effusion -Patient did have thoracentesis, with analysis of fluid on 12/22/2015 which showed reactive mesothelial cells, no malignancy. -Chest x-ray showed small pleural effusion -Will consult interventional radiology for possible ultrasound guided thoracentesis  Acute encephalopathy -Patient currently alert and oriented to person place and time on my examination -Spoke with son via phone, who states patient is not back to his baseline. His mental status has been waxing and waning over the last week. -CT head -Ammonia level  37, Lactulose was given -Etiology of acute encephalopathy currently not clear. Ammonia level unlikely to be causing hepatic encephalopathy at this time. Patient does have a hepatic mass. Spoke with Dr. Irene Limbo regarding liver mass, patient does appear to have metastatic disease. Could altered mental status be secondary to this?  Liver mass/metastatic soft tissue sarcoma -Patient has been followed by Dr. Irene Limbo, who stated that patient's family wanted a second opinion. Spoke with patient's son who stated patient has a follow-up appointment on 02/01/2016 at [facility]. -Patient has artery had biopsy. -Treatment plan pending second opinion, outpatient.  Essential hypertension -Continue Cozaar  Hyperlipidemia -Continue statin  Coronary artery disease -Currently chest pain-free -Continue statin, Cozaar  Thrombocytopenia/anemia -Also be related to the above. -monitor CBC  Depression -Continue Celexa  Hypothyroidism -Continue Synthroid  DVT Prophylaxis  SCDs  Code Status: Full  Family Communication: None bedside. Son via phone.  Disposition Plan: In observation. Pending IR for possible ultrasound-guided thoracentesis. Likely discharge within 24 hours.  Consultants IR Oncology, Dr. Irene Limbo, via phone  Procedures  None  Antibiotics   Anti-infectives    None      Subjective:   Randy Macdonald seen and examined today.  Patient denies any further shortness of breath. Denies chest pain, abdominal pain, nausea or vomiting. States he does have abdominal pain when his stomach is "mashed on".    Objective:   Vitals:   01/28/16 0207 01/28/16 0250 01/28/16 0256 01/28/16 1250  BP:   (!) 143/57 (!) 123/54  Pulse: 89  86 88  Resp: 21  18 18   Temp:   98.2 F (36.8 C) 97.8 F (36.6 C)  TempSrc:   Oral Oral  SpO2: 94% 94% 94% 91%  Weight:   92.8 kg (204 lb 8 oz)   Height:   5\' 8"  (1.727 m)     Intake/Output Summary (Last 24 hours) at 01/28/16 1338 Last data filed at 01/28/16  0910  Gross per 24 hour  Intake                0 ml  Output              401 ml  Net             -401 ml   Filed Weights   01/27/16 1801 01/28/16 0256  Weight: 93.4 kg (206 lb) 92.8 kg (204 lb 8 oz)    Exam  General: Well developed, well nourished, NAD, appears stated age  HEENT: NCAT,  mucous membranes moist.   Cardiovascular: S1 S2 auscultated, no rubs, murmurs or gallops. Regular rate and rhythm.  Respiratory: Clear to auscultation bilaterally with equal chest rise  Abdomen: Soft, TTP, nondistended, + bowel sounds  Extremities: warm dry without cyanosis clubbing or edema  Neuro: AAOx3, nonfocal  Psych: Normal affect and demeanor with intact judgement and insight   Data Reviewed: I have personally reviewed following labs and imaging studies  CBC:  Recent Labs Lab 01/27/16 1807 01/28/16 0306  WBC 4.0 3.8*  HGB 11.2* 10.3*  HCT 35.2* 32.5*  MCV 86.9 86.7  PLT 110* 85*   Basic Metabolic Panel:  Recent Labs Lab 01/27/16 1807 01/28/16 0306  NA 133* 134*  K 3.9 3.8  CL 94* 95*  CO2 29 29  GLUCOSE 387* 352*  BUN 14 13  CREATININE 0.83 0.77  CALCIUM 9.3 9.0   GFR: Estimated Creatinine Clearance: 93.7 mL/min (by C-G formula based on SCr of 0.77 mg/dL). Liver Function Tests:  Recent Labs Lab 01/27/16 1807  AST 39  ALT 31  ALKPHOS 193*  BILITOT 1.0  PROT 7.1  ALBUMIN 2.4*   No results for input(s): LIPASE, AMYLASE in the last 168 hours.  Recent Labs Lab 01/27/16 1807  AMMONIA 37*   Coagulation Profile:  Recent Labs Lab 01/28/16 0306  INR 1.31   Cardiac Enzymes: No results for input(s): CKTOTAL, CKMB, CKMBINDEX, TROPONINI in the last 168 hours. BNP (last 3 results) No results for input(s): PROBNP in the last 8760 hours. HbA1C: No results for input(s): HGBA1C in the last 72 hours. CBG:  Recent Labs Lab 01/27/16 1801 01/27/16 2306 01/28/16 0555 01/28/16 1150  GLUCAP 368* 342* 354* 291*   Lipid Profile:  Recent Labs   01/28/16 0306  CHOL 139  HDL 17*  LDLCALC 102*  TRIG 98  CHOLHDL 8.2   Thyroid Function Tests: No results for input(s): TSH, T4TOTAL, FREET4, T3FREE, THYROIDAB in the last 72 hours. Anemia Panel: No results for input(s): VITAMINB12, FOLATE, FERRITIN, TIBC, IRON, RETICCTPCT in the last 72 hours. Urine analysis:    Component Value Date/Time   COLORURINE YELLOW 01/27/2016 2344   APPEARANCEUR CLEAR 01/27/2016 2344   LABSPEC 1.027 01/27/2016 2344   PHURINE 6.0 01/27/2016 2344   GLUCOSEU >1000 (A) 01/27/2016 2344   HGBUR NEGATIVE 01/27/2016 2344   BILIRUBINUR NEGATIVE 01/27/2016 2344   KETONESUR 15 (A) 01/27/2016 2344   PROTEINUR NEGATIVE 01/27/2016 2344   NITRITE NEGATIVE 01/27/2016 2344   LEUKOCYTESUR NEGATIVE 01/27/2016 2344   Sepsis Labs: @LABRCNTIP (procalcitonin:4,lacticidven:4)  )No results found for this or any previous visit (from the past 240 hour(s)).    Radiology Studies: Dg Chest 2 View  Result Date: 01/28/2016 CLINICAL  DATA:  70 year old male with confusion EXAM: CHEST  2 VIEW COMPARISON:  Chest radiograph dated 12/28/2015 FINDINGS: There is consolidative changes of the right lung base likely combination of eventration of the diaphragm, small pleural effusion, and atelectasis versus infiltrate. The left lung is clear. There is no pneumothorax. The cardiac silhouette is within normal limits with no acute osseous pathology. IMPRESSION: Right lung base consolidative changes likely combination of small pleural effusion and atelectasis/ infiltrate. Underlying mass is not excluded. Clinical correlation and follow-up resolution recommended. Electronically Signed   By: Anner Crete M.D.   On: 01/28/2016 00:16   Ct Head Wo Contrast  Result Date: 01/28/2016 CLINICAL DATA:  Altered mentation. History of liver, bone and skin cancer. EXAM: CT HEAD WITHOUT CONTRAST TECHNIQUE: Contiguous axial images were obtained from the base of the skull through the vertex without intravenous  contrast. COMPARISON:  None. FINDINGS: BRAIN: The ventricles and sulci are normal for age. No intraparenchymal hemorrhage, mass effect nor midline shift. Patchy supratentorial white matter hypodensities within normal range for patient's age, though non-specific are most compatible with chronic small vessel ischemic disease. No acute large vascular territory infarcts. No abnormal extra-axial fluid collections. Basal cisterns are patent. VASCULAR: Moderate calcific atherosclerosis of the carotid siphons. SKULL: No skull fracture. No significant scalp soft tissue swelling. SINUSES/ORBITS: The mastoid air-cells and included paranasal sinuses are well-aerated.The included ocular globes and orbital contents are non-suspicious. OTHER: None. IMPRESSION: Cerebral atrophy with small vessel ischemic disease of periventricular white matter. No apparent intra-axial mass, edema or midline shift. No hemorrhage. Electronically Signed   By: Ashley Royalty M.D.   On: 01/28/2016 00:43     Scheduled Meds: . citalopram  20 mg Oral Daily  . dextromethorphan-guaiFENesin  1 tablet Oral BID  . insulin aspart  0-5 Units Subcutaneous QHS  . insulin aspart  0-9 Units Subcutaneous TID WC  . lactulose  20 g Oral BID  . levothyroxine  75 mcg Oral QAC breakfast  . losartan  100 mg Oral Daily  . rosuvastatin  40 mg Oral q1800   Continuous Infusions:    LOS: 0 days   Time Spent in minutes   30 minutes  Oseph Imburgia D.O. on 01/28/2016 at 1:38 PM  Between 7am to 7pm - Pager - [phone]  After 7pm go to [email or web address] - password [email or web address]  And look for the night coverage person covering for me after hours  [facility] Office  [phone]

## 2016-01-28 NOTE — Discharge Summary (Signed)
Physician Discharge Summary  Randy Macdonald [id number] DOB: April 26, 1945 DOA: 01/27/2016  PCP: Donnie Coffin, MD  Admit date: 01/27/2016 Discharge date: 01/28/2016  Time spent: 45 minutes  Recommendations for Outpatient Follow-up:  Patient will be discharged to home.  Patient will need to follow up with primary care provider within one week of discharge, repeat CBC.  Follow up with oncology. Patient should continue medications as prescribed.  Patient should follow a Heart healthy/carb modified.   Discharge Diagnoses:  Acute respiratory failure with supposed hypoxia Recurrent Right pleural effusion Acute encephalopathy Liver mass/metastatic soft tissue sarcoma Essential hypertension Hyperlipidemia Coronary artery disease Thrombocytopenia/anemia Depression Hypothyroidism  Discharge Condition: Stable  Diet recommendation: Heart healthy/carb modified  Filed Weights   01/27/16 1801 01/28/16 0256  Weight: 93.4 kg (206 lb) 92.8 kg (204 lb 8 oz)    History of present illness:  on 01/28/2016 by Dr. Roselyn Meier Handyis a 70 y.o.malewith medical history significant of for hypertension, hyperlipidemia, diabetes mellitus, GERD, hypothyroidism, depression, chronic thrombocytopenia, right pleural effusion, anemia, CAD, s/p of stent placement, hepatic mass (pending biopsy, followed by Dr. Irene Limbo), whopresents with shortness of breath and altered mental status.   Per report, pt was confused in the past several days. pt is "talking off the wall" per his wife. He moves all extremities, no unilateral weakness, numbness or tingling sensations. No recent interval hearing loss. When I saw pt in ED, he is mildy confused, but oriented 3. He has all questions reasonably. Pt statesthathe has a shortness of breath and dry cough. No fever or chills. Patient states that he has mild chest pain sometimes, but no chest pain currently. He has mild diffused AP, butdenies nausea, vomiting, diarrhea,  symptoms of UTI.  Hospital Course:  Acute respiratory failure with supposed hypoxia -No documentation noted O2 sats <88% -Patient is currently on room air, oxygen saturations well into the 90s. -Patient currently denies any shortness of breath  Recurrent Right pleural effusion -Patient did have thoracentesis, with analysis of fluid on 12/22/2015 which showed reactive mesothelial cells, no malignancy. -Chest x-ray showed small pleural effusion -s/p Right ultrasound guided thoracentesis 1.6L removed   Acute encephalopathy -Patient currently alert and oriented to person place and time on my examination -Spoke with son via phone, who states patient is not back to his baseline. His mental status has been waxing and waning over the last week. -CT head -Ammonia level 37, Lactulose was given -Etiology of acute encephalopathy currently not clear. Ammonia level unlikely to be causing hepatic encephalopathy at this time. Patient does have a hepatic mass. Spoke with Dr. Irene Limbo regarding liver mass, patient does appear to have metastatic disease. Could altered mental status be secondary to this?  Liver mass/metastatic soft tissue sarcoma -Patient has been followed by Dr. Irene Limbo, who stated that patient's family wanted a second opinion. Spoke with patient's son who stated patient has a follow-up appointment on 02/01/2016 at [facility]. -Patient has artery had biopsy. -Treatment plan pending second opinion, outpatient.  Essential hypertension -Continue Cozaar  Hyperlipidemia -Continue statin  Coronary artery disease -Currently chest pain-free -Continue statin, Cozaar  Thrombocytopenia/anemia -Also be related to the above. -Repeat CBC in one week  Depression -Continue Celexa  Diabetes Mellitus, type II -Continue home medications  Hypothyroidism -Continue Synthroid  Consultants IR Oncology, Dr. Irene Limbo, via phone  Procedures  US thoracentesis  Discharge Exam: Vitals:     01/28/16 1604 01/28/16 1605  BP: (!) 119/52 (!) 128/59  Pulse:    Resp:  Temp:     Feels better today.  No complications after thoracentesis.  Very hungry. Would like to go home.   Exam  General: Well developed, well nourished, NAD, appears stated age  70: NCAT,  mucous membranes moist.   Cardiovascular: S1 S2 auscultated, RRR  Respiratory: Clear to auscultation bilaterally with equal chest rise  Abdomen: Soft, TTP, nondistended, + bowel sounds  Extremities: warm dry without cyanosis clubbing or edema  Neuro: AAOx3, nonfocal  Psych: Normal affect and demeanor with intact judgement and insight, pleasant  Discharge Instructions Discharge Instructions    Discharge instructions    Complete by:  As directed    Patient will be discharged to home.  Patient will need to follow up with primary care provider within one week of discharge, repeat CBC.  Follow up with oncology. Patient should continue medications as prescribed.  Patient should follow a Heart healthy/carb modified.     Current Discharge Medication List    CONTINUE these medications which have NOT CHANGED   Details  citalopram (CELEXA) 20 MG tablet Take 1 tablet (20 mg total) by mouth daily. Qty: 30 tablet, Refills: 0    CRESTOR 40 MG tablet Take 40 mg by mouth daily.    Associated Diagnoses: Leukopenia; Thrombocytopenia (HCC)    JANUMET XR [phone] MG TB24 Take 100-1,000 mg by mouth 2 (two) times daily.    levothyroxine (SYNTHROID, LEVOTHROID) 75 MCG tablet Take 75 mcg by mouth daily.   Associated Diagnoses: Leukopenia; Thrombocytopenia (HCC)    LORazepam (ATIVAN) 0.5 MG tablet Take 1-2 tablets (0.5-1 mg total) by mouth at bedtime as needed for sleep. Qty: 60 tablet, Refills: 0    losartan (COZAAR) 100 MG tablet Take 1 tablet (100 mg total) by mouth daily. Qty: 90 tablet, Refills: 3   Associated Diagnoses: Coronary artery disease involving native coronary artery of native heart without angina pectoris     NOVOLIN N RELION 100 UNIT/ML injection Inject 75 Units into the skin daily before breakfast.    Associated Diagnoses: Leukopenia; Thrombocytopenia (HCC)    NOVOLIN R RELION 100 UNIT/ML injection Inject 50 Units into the skin 3 (three) times daily before meals.    Associated Diagnoses: Leukopenia; Thrombocytopenia (HCC)    oxyCODONE (OXY IR/ROXICODONE) 5 MG immediate release tablet Take 1 tablet (5 mg total) by mouth every 4 (four) hours as needed for moderate pain, severe pain or breakthrough pain. Qty: 60 tablet, Refills: 0    ondansetron (ZOFRAN) 4 MG tablet Take 1 tablet (4 mg total) by mouth every 8 (eight) hours as needed for nausea or vomiting. Qty: 30 tablet, Refills: 0      STOP taking these medications     phytonadione (VITAMIN K) 5 MG tablet      senna-docusate (SENNA S) 8.6-50 MG tablet        Allergies  Allergen Reactions  . Codeine Nausea And Vomiting  . Sulfa Antibiotics Nausea And Vomiting   Follow-up Information    Donnie Coffin, MD. Schedule an appointment as soon as possible for a visit today.   Specialty:  Family Medicine Why:  Hospital follow up Contact information: [address] [place] [address] [phone]            The results of significant diagnostics from this hospitalization (including imaging, microbiology, ancillary and laboratory) are listed below for reference.    Significant Diagnostic Studies: Dg Chest 1 View  Result Date: 01/28/2016 CLINICAL DATA:  Status post right thoracentesis EXAM: CHEST 1 VIEW  COMPARISON:  01/27/2016 FINDINGS: Significant reduction in right-sided pleural effusion is noted following thoracentesis. Minimal right basilar atelectasis is seen. No pneumothorax is noted. The left lung remains clear. Cardiac shadow is unremarkable. No bony abnormality is seen. IMPRESSION: No pneumothorax following right thoracentesis. Electronically Signed   By: Inez Catalina M.D.   On: 01/28/2016 16:25   Dg  Chest 2 View  Result Date: 01/28/2016 CLINICAL DATA:  70 year old male with confusion EXAM: CHEST  2 VIEW COMPARISON:  Chest radiograph dated 12/28/2015 FINDINGS: There is consolidative changes of the right lung base likely combination of eventration of the diaphragm, small pleural effusion, and atelectasis versus infiltrate. The left lung is clear. There is no pneumothorax. The cardiac silhouette is within normal limits with no acute osseous pathology. IMPRESSION: Right lung base consolidative changes likely combination of small pleural effusion and atelectasis/ infiltrate. Underlying mass is not excluded. Clinical correlation and follow-up resolution recommended. Electronically Signed   By: Anner Crete M.D.   On: 01/28/2016 00:16   Ct Head Wo Contrast  Result Date: 01/28/2016 CLINICAL DATA:  Altered mentation. History of liver, bone and skin cancer. EXAM: CT HEAD WITHOUT CONTRAST TECHNIQUE: Contiguous axial images were obtained from the base of the skull through the vertex without intravenous contrast. COMPARISON:  None. FINDINGS: BRAIN: The ventricles and sulci are normal for age. No intraparenchymal hemorrhage, mass effect nor midline shift. Patchy supratentorial white matter hypodensities within normal range for patient's age, though non-specific are most compatible with chronic small vessel ischemic disease. No acute large vascular territory infarcts. No abnormal extra-axial fluid collections. Basal cisterns are patent. VASCULAR: Moderate calcific atherosclerosis of the carotid siphons. SKULL: No skull fracture. No significant scalp soft tissue swelling. SINUSES/ORBITS: The mastoid air-cells and included paranasal sinuses are well-aerated.The included ocular globes and orbital contents are non-suspicious. OTHER: None. IMPRESSION: Cerebral atrophy with small vessel ischemic disease of periventricular white matter. No apparent intra-axial mass, edema or midline shift. No hemorrhage. Electronically  Signed   By: Ashley Royalty M.D.   On: 01/28/2016 00:43   Nm Pet Image Initial (pi) Skull Base To Thigh  Result Date: 01/12/2016 CLINICAL DATA:  Initial treatment strategy for left liver mass on CT and MRI. No given history of malignancy. Right thoracentesis 12/23/2015 demonstrated reactive mesothelial cells. EXAM: NUCLEAR MEDICINE PET SKULL BASE TO THIGH TECHNIQUE: 9.51 mCi F-18 FDG was injected intravenously. Full-ring PET imaging was performed from the skull base to thigh after the radiotracer. CT data was obtained and used for attenuation correction and anatomic localization. FASTING BLOOD GLUCOSE:  Value: 127 mg/dl COMPARISON:  CT 12/22/2015 and MRI 12/23/2015. FINDINGS: NECK No hypermetabolic cervical lymph nodes are identified.There are no lesions of the pharyngeal mucosal space. CHEST There are no hypermetabolic mediastinal, hilar or axillary lymph nodes. There is a recurrent moderate-sized dependent right pleural effusion without associated abnormal metabolic activity. There is partial right lower and middle lobe collapse. No suspicious pulmonary activity is seen. However, there are several small pulmonary nodules, measuring up to 5 mm in the lingula on image 29, suspicious for possible metastatic disease. Coronary artery atherosclerosis noted. ABDOMEN/PELVIS The dominant mass replacing most of the lateral segment left hepatic lobe is hypermetabolic. This demonstrates central necrosis and an SUV max of 22.7. This corresponds with an approximately 10.7 x 6.9 cm mass on the CT images. There is an adjacent hypermetabolic 2.8 x 2.0 cm lesion in the medial segment of the left hepatic lobe on image 105. In addition, there is a hypermetabolic lesion in  the dome of the right hepatic lobe, measuring 1.7 cm on image 100 and (SUV max 10.9). 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 No hypermetabolic activity is seen within the pancreas, spleen or adrenal glands. Cystic lesion is again noted within the pancreatic  body. There are hypermetabolic lymph nodes within the porta hepatis. There is a small amount of ascites without associated hypermetabolic activity. Calcified gallstones, a small nonobstructing calculus in the upper pole of the right kidney and a ventral umbilical hernia containing fat are noted. SKELETON There are numerous hypermetabolic osseous lesions throughout the axial and appendicular skeleton consistent with metastatic disease. The individual lesions are not well seen on the CT images, demonstrating no significant lysis or sclerosis. There is no pathologic fracture. Lesions are noted within both humeral diaphyses, the right mandibular body, throughout the thoracolumbar spine (large lesion on the right at T9 extending into the posterior elements has an SUV max of 14.9), throughout the bony pelvis, including the sacrum and both proximal femurs. There is a large lesion involving the intertrochanteric region of the right femoral neck demonstrating an SUV max of 12.6. IMPRESSION: 1. Multifocal hypermetabolic liver masses with dominant centrally necrotic lesion in the lateral segment of the left lobe. 2. Multiple hypermetabolic osseous metastases. The individual lesions are not well seen on the CT images. No evidence of pathologic fracture or epidural tumor. 3. Hypermetabolic lymph nodes in the porta hepatis consistent with metastatic disease. 4. Small pulmonary nodules bilaterally, too small to characterize by PET-CT. No hypermetabolic activity is seen within recurrent right pleural effusion or a small amount of ascites. 5. The osseous metastatic disease is somewhat atypical for hepatocellular carcinoma, and the patient's serum AFP level is not elevated. No other primary malignancy identified. Hepatic tissue sampling recommended. Electronically Signed   By: Richardean Sale M.D.   On: 01/12/2016 12:26   US Biopsy  Result Date: 01/18/2016 INDICATION: No known primary, now with infiltrative hypermetabolic mass  involving the lateral segment of left lobe of the liver. Request made for ultrasound-guided liver mass biopsy for tissue diagnostic purposes. EXAM: ULTRASOUND GUIDED LIVER LESION BIOPSY COMPARISON:  PET-CT - 01/12/2016; abdominal MRI - 12/23/2015 MEDICATIONS: None ANESTHESIA/SEDATION: Fentanyl 50 mcg IV; Versed 3 mg IV Total Moderate Sedation time: 13 minutes. The patient's level of consciousness and vital signs were monitored continuously by radiology nursing throughout the procedure under my direct supervision. COMPLICATIONS: None immediate. PROCEDURE: Informed written consent was obtained from the patient after a discussion of the risks, benefits and alternatives to treatment. The patient understands and consents the procedure. A timeout was performed prior to the initiation of the procedure. Ultrasound scanning was performed of the right upper abdominal quadrant demonstrates mixed echogenic at least 11.7 x 6.7 cm solid mass within the lateral segment of left lobe of the liver (image 4) compatible with the findings seen on preceding PET-CT and abdominal MRI. The procedure was planned. The midline of the upper abdomen was prepped and draped in the usual sterile fashion. The overlying soft tissues were anesthetized with 1% lidocaine with epinephrine. A 17 gauge, 6.8 cm co-axial needle was advanced into a peripheral aspect of the lesion. This was followed by 5 core biopsies with an 18 gauge core device under direct ultrasound guidance. The coaxial needle tract was embolized with a small amount of Gel-Foam slurry and superficial hemostasis was obtained with manual compression. Post procedural scanning was negative for definitive area of hemorrhage or additional complication. A dressing was placed. The patient tolerated the procedure well  without immediate post procedural complication. IMPRESSION: Technically successful ultrasound guided core needle biopsy of infiltrative mass within the lateral segment of the left  lobe of the liver. Electronically Signed   By: Sandi Mariscal M.D.   On: 01/18/2016 15:07   US Thoracentesis Asp Pleural Space W/img Guide  Result Date: 01/28/2016 INDICATION: Patient with history of coronary artery disease, metastatic soft tissue sarcoma, recurrent right pleural effusion. Request made for therapeutic right thoracentesis. EXAM: ULTRASOUND GUIDED THERAPEUTIC RIGHT THORACENTESIS MEDICATIONS: None. COMPLICATIONS: None immediate. PROCEDURE: An ultrasound guided thoracentesis was thoroughly discussed with the patient and questions answered. The benefits, risks, alternatives and complications were also discussed. The patient understands and wishes to proceed with the procedure. Written consent was obtained. Ultrasound was performed to localize and mark an adequate pocket of fluid in the right chest. The area was then prepped and draped in the normal sterile fashion. 1% Lidocaine was used for local anesthesia. Under ultrasound guidance a Yueh catheter was introduced. Thoracentesis was performed. The catheter was removed and a dressing applied. FINDINGS: A total of approximately 1.6 liters of slightly hazy, amber fluid was removed. IMPRESSION: Successful ultrasound guided therapeutic right thoracentesis yielding 1.6 liters of pleural fluid. Read by: Rowe Robert, PA-C Electronically Signed   By: Aletta Edouard M.D.   On: 01/28/2016 16:14    Microbiology: No results found for this or any previous visit (from the past 240 hour(s)).   Labs: Basic Metabolic Panel:  Recent Labs Lab 01/27/16 1807 01/28/16 0306  NA 133* 134*  K 3.9 3.8  CL 94* 95*  CO2 29 29  GLUCOSE 387* 352*  BUN 14 13  CREATININE 0.83 0.77  CALCIUM 9.3 9.0   Liver Function Tests:  Recent Labs Lab 01/27/16 1807  AST 39  ALT 31  ALKPHOS 193*  BILITOT 1.0  PROT 7.1  ALBUMIN 2.4*   No results for input(s): LIPASE, AMYLASE in the last 168 hours.  Recent Labs Lab 01/27/16 1807  AMMONIA 37*   CBC:  Recent  Labs Lab 01/27/16 1807 01/28/16 0306  WBC 4.0 3.8*  HGB 11.2* 10.3*  HCT 35.2* 32.5*  MCV 86.9 86.7  PLT 110* 85*   Cardiac Enzymes: No results for input(s): CKTOTAL, CKMB, CKMBINDEX, TROPONINI in the last 168 hours. BNP: BNP (last 3 results) No results for input(s): BNP in the last 8760 hours.  ProBNP (last 3 results) No results for input(s): PROBNP in the last 8760 hours.  CBG:  Recent Labs Lab 01/27/16 1801 01/27/16 2306 01/28/16 0555 01/28/16 1150 01/28/16 1641  GLUCAP 368* 342* 354* 291* 263*       Signed:  Cristal Ford  [facility] 01/28/2016, 5:20 PM

## 2016-01-28 NOTE — H&P (Signed)
History and Physical    Randy Macdonald [id number] DOB: 04-Jun-1945 DOA: 01/27/2016  Referring MD/NP/PA:   PCP: Randy Coffin, MD   Patient coming from:  The patient is coming from home.  At baseline, pt is independent for most of ADL.   Chief Complaint: Shortness of breath and AMS  HPI: Randy Macdonald is a 70 y.o. male with medical history significant of for hypertension, hyperlipidemia, diabetes mellitus, GERD, hypothyroidism, depression, chronic thrombocytopenia, right pleural effusion, anemia, CAD, s/p of stent placement, hepatic mass (pending biopsy, followed by Dr. Irene Limbo), who presents with shortness of breath and altered mental status.   Per report, pt was confused in the past several days. pt is "talking off the wall" per his wife. He moves all extremities, no unilateral weakness, numbness or tingling sensations. No recent interval hearing loss. When I saw pt in ED, he is mildy confused, but oriented 3. He has all questions reasonably. Pt states that he has a shortness of breath and dry cough. No fever or chills. Patient states that he has mild chest pain sometimes, but no chest pain currently. He has mild diffused AP, but denies nausea, vomiting, diarrhea, symptoms of UTI.  ED Course: pt was found to have  WBC 4.0, negative urinalysis, electrolytes and renal function okay, ammonia level 37, temperature normal, has tachycardia, tachypnea, oxygen saturations 92% on room air. Chest x-ray showed moderate right pleural effusion. CT head is negative for acute intracranial abnormalities. Patient is placed on telemetry bed for observation.  Review of Systems:   General: no fevers, chills, no changes in body weight, has fatigue HEENT: no blurry vision, hearing changes or sore throat Respiratory: has dyspnea, coughing, no wheezing CV: no chest pain, no palpitations GI: no nausea, vomiting, has abdominal pain, no diarrhea, constipation GU: no dysuria, burning on urination, increased urinary  frequency, hematuria  Ext: no leg edema Neuro: no unilateral weakness, numbness, or tingling, no vision change or hearing loss. Has confusion. Skin: no rash, no skin tear. MSK: No muscle spasm, no deformity, no limitation of range of movement in spin Heme: No easy bruising.  Travel history: No recent long distant travel.  Allergy:  Allergies  Allergen Reactions  . Codeine Nausea And Vomiting  . Sulfa Antibiotics Nausea And Vomiting    Past Medical History:  Diagnosis Date  . Anemia   . Arthritis    "plenty" (12/22/2015)  . CAD (coronary artery disease)    a. LHC 10/10: pLAD 80, dLAD 70, oD1 50, D2 70, EF 55% >> PCI: 2.5 x 18 mm Endeavor DES to mLAD and 3 x 12 mm Endeavor DES to pLAD   . Depression   . GERD (gastroesophageal reflux disease)   . HTN (hypertension)   . Hyperlipidemia   . Hypothyroidism   . Leukopenia 09/01/2011  . Macrocytosis without anemia 09/01/2011  . Osteoarthritis   . Pleural effusion on right 12/22/2015  . Skin cancer    "right cheek", liver ca, bone ca  . Thrombocytopenia ([place]) 09/01/2011  . Type II diabetes mellitus ([place])     Past Surgical History:  Procedure Laterality Date  . CORONARY ANGIOPLASTY WITH STENT PLACEMENT Bilateral 01/2009   "2 stents"  . HERNIA REPAIR    . SKIN CANCER EXCISION Right    "cheek"  . TONSILLECTOMY  1966  . UMBILICAL HERNIA REPAIR  1990s    Social History:  reports that he quit smoking about 52 years ago. His smoking use included Cigarettes. He quit after 4.00 years  of use. He has never used smokeless tobacco. He reports that he does not drink alcohol or use drugs.  Family History:  Family History  Problem Relation Age of Onset  . Thyroid disease Sister   . CAD Brother      Prior to Admission medications   Medication Sig Start Date End Date Taking? Authorizing Provider  citalopram (CELEXA) 20 MG tablet Take 1 tablet (20 mg total) by mouth daily. 12/24/15  Yes Mariel Aloe, MD  CRESTOR 40 MG tablet Take 40 mg by  mouth daily.  07/21/11  Yes Historical Provider, MD  JANUMET XR [phone] MG TB24 Take 100-1,000 mg by mouth 2 (two) times daily. 06/24/15  Yes Historical Provider, MD  levothyroxine (SYNTHROID, LEVOTHROID) 75 MCG tablet Take 75 mcg by mouth daily.   Yes Historical Provider, MD  LORazepam (ATIVAN) 0.5 MG tablet Take 1-2 tablets (0.5-1 mg total) by mouth at bedtime as needed for sleep. 01/20/16  Yes Brunetta Genera, MD  losartan (COZAAR) 100 MG tablet Take 1 tablet (100 mg total) by mouth daily. 09/30/15  Yes Scott T Weaver, PA-C  NOVOLIN N RELION 100 UNIT/ML injection Inject 75 Units into the skin daily before breakfast.  08/07/11  Yes Historical Provider, MD  NOVOLIN R RELION 100 UNIT/ML injection Inject 50 Units into the skin 3 (three) times daily before meals.  08/06/11  Yes Historical Provider, MD  oxyCODONE (OXY IR/ROXICODONE) 5 MG immediate release tablet Take 1 tablet (5 mg total) by mouth every 4 (four) hours as needed for moderate pain, severe pain or breakthrough pain. 01/20/16  Yes Brunetta Genera, MD  ondansetron (ZOFRAN) 4 MG tablet Take 1 tablet (4 mg total) by mouth every 8 (eight) hours as needed for nausea or vomiting. Patient not taking: Reported on 01/27/2016 01/20/16   Brunetta Genera, MD  phytonadione (VITAMIN K) 5 MG tablet Take 1 tablet (5 mg total) by mouth daily. Patient not taking: Reported on 01/27/2016 01/03/16   Brunetta Genera, MD  senna-docusate (SENNA S) 8.6-50 MG tablet Take 2 tablets by mouth at bedtime. Patient not taking: Reported on 01/27/2016 01/03/16   Brunetta Genera, MD    Physical Exam: Vitals:   01/28/16 0121 01/28/16 0207 01/28/16 0250 01/28/16 0256  BP:    (!) 143/57  Pulse: 88 89  86  Resp:  21  18  Temp:    98.2 F (36.8 C)  TempSrc:    Oral  SpO2: 92% 94% 94% 94%  Weight:    92.8 kg (204 lb 8 oz)  Height:    5\' 8"  (1.727 m)   General: Not in acute distress HEENT:       Eyes: PERRL, EOMI, no scleral icterus.       ENT: No  discharge from the ears and nose, no pharynx injection, no tonsillar enlargement.        Neck: No JVD, no bruit, no mass felt. Heme: No neck lymph node enlargement. Cardiac: S1/S2, RRR, No murmurs, No gallops or rubs. Respiratory: has decreased air movement on the right side. No rales, wheezing, rhonchi or rubs. GI: Soft, mildly distended, has mild tenderness difflusely, no rebound pain, no organomegaly, BS present. GU: No hematuria Ext: No pitting leg edema bilaterally. 2+DP/PT pulse bilaterally. Musculoskeletal: No joint deformities, No joint redness or warmth, no limitation of ROM in spin. Skin: No rashes.  Neuro: Alert, mildly confused, but oriented X3, cranial nerves II-XII grossly intact, moves all extremities normally. Muscle strength 5/5 in all  extremities, sensation to light touch intact. Knee reflex 1+ bilaterally. Negative Babinski's sign. Normal finger to nose test. Psych: Patient is not psychotic, no suicidal or hemocidal ideation.  Labs on Admission: I have personally reviewed following labs and imaging studies  CBC:  Recent Labs Lab 01/27/16 1807 01/28/16 0306  WBC 4.0 3.8*  HGB 11.2* 10.3*  HCT 35.2* 32.5*  MCV 86.9 86.7  PLT 110* 85*   Basic Metabolic Panel:  Recent Labs Lab 01/27/16 1807 01/28/16 0306  NA 133* 134*  K 3.9 3.8  CL 94* 95*  CO2 29 29  GLUCOSE 387* 352*  BUN 14 13  CREATININE 0.83 0.77  CALCIUM 9.3 9.0   GFR: Estimated Creatinine Clearance: 93.7 mL/min (by C-G formula based on SCr of 0.77 mg/dL). Liver Function Tests:  Recent Labs Lab 01/27/16 1807  AST 39  ALT 31  ALKPHOS 193*  BILITOT 1.0  PROT 7.1  ALBUMIN 2.4*   No results for input(s): LIPASE, AMYLASE in the last 168 hours.  Recent Labs Lab 01/27/16 1807  AMMONIA 37*   Coagulation Profile:  Recent Labs Lab 01/28/16 0306  INR 1.31   Cardiac Enzymes: No results for input(s): CKTOTAL, CKMB, CKMBINDEX, TROPONINI in the last 168 hours. BNP (last 3 results) No  results for input(s): PROBNP in the last 8760 hours. HbA1C: No results for input(s): HGBA1C in the last 72 hours. CBG:  Recent Labs Lab 01/27/16 1801 01/27/16 2306 01/28/16 0555  GLUCAP 368* 342* 354*   Lipid Profile:  Recent Labs  01/28/16 0306  CHOL 139  HDL 17*  LDLCALC 102*  TRIG 98  CHOLHDL 8.2   Thyroid Function Tests: No results for input(s): TSH, T4TOTAL, FREET4, T3FREE, THYROIDAB in the last 72 hours. Anemia Panel: No results for input(s): VITAMINB12, FOLATE, FERRITIN, TIBC, IRON, RETICCTPCT in the last 72 hours. Urine analysis:    Component Value Date/Time   COLORURINE YELLOW 01/27/2016 2344   APPEARANCEUR CLEAR 01/27/2016 2344   LABSPEC 1.027 01/27/2016 2344   PHURINE 6.0 01/27/2016 2344   GLUCOSEU >1000 (A) 01/27/2016 2344   HGBUR NEGATIVE 01/27/2016 2344   BILIRUBINUR NEGATIVE 01/27/2016 2344   KETONESUR 15 (A) 01/27/2016 2344   PROTEINUR NEGATIVE 01/27/2016 2344   NITRITE NEGATIVE 01/27/2016 2344   LEUKOCYTESUR NEGATIVE 01/27/2016 2344   Sepsis Labs: @LABRCNTIP (procalcitonin:4,lacticidven:4) )No results found for this or any previous visit (from the past 240 hour(s)).   Radiological Exams on Admission: Dg Chest 2 View  Result Date: 01/28/2016 CLINICAL DATA:  70 year old male with confusion EXAM: CHEST  2 VIEW COMPARISON:  Chest radiograph dated 12/28/2015 FINDINGS: There is consolidative changes of the right lung base likely combination of eventration of the diaphragm, small pleural effusion, and atelectasis versus infiltrate. The left lung is clear. There is no pneumothorax. The cardiac silhouette is within normal limits with no acute osseous pathology. IMPRESSION: Right lung base consolidative changes likely combination of small pleural effusion and atelectasis/ infiltrate. Underlying mass is not excluded. Clinical correlation and follow-up resolution recommended. Electronically Signed   By: Anner Crete M.D.   On: 01/28/2016 00:16   Ct Head Wo  Contrast  Result Date: 01/28/2016 CLINICAL DATA:  Altered mentation. History of liver, bone and skin cancer. EXAM: CT HEAD WITHOUT CONTRAST TECHNIQUE: Contiguous axial images were obtained from the base of the skull through the vertex without intravenous contrast. COMPARISON:  None. FINDINGS: BRAIN: The ventricles and sulci are normal for age. No intraparenchymal hemorrhage, mass effect nor midline shift. Patchy supratentorial white matter hypodensities within  normal range for patient's age, though non-specific are most compatible with chronic small vessel ischemic disease. No acute large vascular territory infarcts. No abnormal extra-axial fluid collections. Basal cisterns are patent. VASCULAR: Moderate calcific atherosclerosis of the carotid siphons. SKULL: No skull fracture. No significant scalp soft tissue swelling. SINUSES/ORBITS: The mastoid air-cells and included paranasal sinuses are well-aerated.The included ocular globes and orbital contents are non-suspicious. OTHER: None. IMPRESSION: Cerebral atrophy with small vessel ischemic disease of periventricular white matter. No apparent intra-axial mass, edema or midline shift. No hemorrhage. Electronically Signed   By: Ashley Royalty M.D.   On: 01/28/2016 00:43     EKG: Independently reviewed. QTC 480, T-wave flattening diffusely.  Assessment/Plan Principal Problem:   Acute respiratory failure with hypoxia (HCC) Active Problems:   Thrombocytopenia ([place])   Hyperlipidemia   Coronary atherosclerosis of native coronary artery   Hypothyroidism   Essential hypertension, benign   Pleural effusion on right   Delirium   Acute encephalopathy   Acute respiratory failure with hypoxia ([place]): Most likely due to right pleural effusion. This is recurrent issue. Analysis of fluid on 12/22/15 showed reactive mesothelial cells, no maglinancy. Patient does not have fever or leukocytosis, clinically does not seem to have pneumonia.  -Will place on telemetry bed  for observation -Please call IR for possible therapeutic thoracentesis in morning -When necessary albuterol nebulizers - INR/PTT  Thrombocytopenia ([place]): platelet 110. No bleeding tendencies. Likely related to the liver mass. -f/u by CBC  HLD: Last LDL was noted are equal -Continue home medications: Crestor -Check FLP  CAD: s/p of stent. No CP now. -continue cresto  HTN: -continue Cozaar  Acute encephalopathy: Etiology is not clear. Differential diagnosis include delirium and hepatic encephalopathy given hepatic mass. -Lactulose 20 g 3 times a day empirically -Frequent neuro check  Liver mass: pt has been worked up by Dr. Irene Limbo. Per his clinic note on 01/20/16, hepatocellular carcinoma is likely the primary differential, but intrahepatic cholangiocarcinoma and metastatic carcinoma unknown primary is also possible. The plan is to wait for CT-guided biopsy of the left liver mass result. And depending on findings of imaging and pathology, might need to consider surgical evaluation if the tumor is localized to the liver . -f/u with Dr. Irene Limbo.   DVT ppx: SCD Code Status: Full code Family Communication: None at bed side.  Disposition Plan:  Anticipate discharge back to previous home environment Consults called:  none Admission status: Obs / tele   Date of Service 01/28/2016    Darian Ace, [facility] Pager [phone]  If 7PM-7AM, please contact night-coverage [email or web address] Password [email or web address] 01/28/2016, 7:19 AM

## 2016-01-28 NOTE — Discharge Instructions (Signed)
Pleural Effusion °A pleural effusion is an abnormal buildup of fluid in the layers of tissue between your lungs and the inside of your chest (pleural space). These two layers of tissue that line both your lungs and the inside of your chest are called pleura. Usually, there is no air in the space between the pleura, only a thin layer of fluid. If left untreated, a large amount of fluid can build up and cause the lung to collapse. A pleural effusion is usually caused by another disease that requires treatment. °The two main types of pleural effusion are: °· Transudative pleural effusion. This happens when fluid leaks into the pleural space because of a low protein count in your blood or high blood pressure in your vessels. Heart failure often causes this. °· Exudative infusion. This occurs when fluid collects in the pleural space from blocked blood vessels or lymph vessels. Some lung diseases, injuries, and cancers can cause this type of effusion. °CAUSES °Pleural effusion can be caused by: °· Heart failure. °· A blood clot in the lung (pulmonary embolism). °· Pneumonia. °· Cancer. °· Liver failure (cirrhosis). °· Kidney disease. °· Complications from surgery, such as from open heart surgery. °SIGNS AND SYMPTOMS °In some cases, pleural effusion may cause no symptoms. Symptoms can include: °· Shortness of breath, especially when lying down. °· Chest pain, often worse when taking a deep breath. °· Fever. °· Dry cough that is lasting (chronic). °· Hiccups. °· Rapid breathing. °An underlying condition that is causing the pleural effusion (such as heart failure, pneumonia, blood clots, tuberculosis, or cancer) may also cause additional symptoms. °DIAGNOSIS °Your health care provider may suspect pleural effusion based on your symptoms and medical history. Your health care provider will also do a physical exam and a chest X-ray. If the X-ray shows there is fluid in your chest, you may need to have this fluid removed using a  needle (thoracentesis) so it can be tested. °You may also have: °· Imaging studies of the chest, such as: °¨ Ultrasound. °¨ CT scan. °· Blood tests for kidney and liver function. °TREATMENT °Treatment depends on the cause of the pleural effusion. Treatment may include: °· Taking antibiotic medicines to clear up an infection that is causing the pleural effusion. °· Placing a tube in the chest to drain the effusion (tube thoracostomy). This procedure is often used when there is an infection in the fluid. °· Surgery to remove the fibrous outer layer of tissue from the pleural space (decortication). °· Thoracentesis, which can improve cough and shortness of breath. °· A procedure to put medicine into the chest cavity to seal the pleural space to prevent fluid buildup (pleurodesis). °· Chemotherapy and radiation therapy. These may be required in the case of cancerous (malignant) pleural effusion. °HOME CARE INSTRUCTIONS °· Take medicines only as directed by your health care provider. °· Keep track of how long you can gently exercise before you get short of breath. Try simply walking at first. °· Do not use any tobacco products, including cigarettes, chewing tobacco, or electronic cigarettes. If you need help quitting, ask your health care provider. °· Keep all follow-up visits as directed by your health care provider. This is important. °SEEK MEDICAL CARE IF: °· The amount of time that you are able to exercise decreases or does not improve with time. °· You have pain or signs of infection at the puncture site if you had thoracentesis. Watch for: °¨ Drainage. °¨ Redness. °¨ Swelling. °· You have a fever. °  SEEK IMMEDIATE MEDICAL CARE IF: °· You are short of breath. °· You develop chest pain. °· You develop a new cough. °MAKE SURE YOU: °· Understand these instructions. °· Will watch your condition. °· Will get help right away if you are not doing well or get worse. °  °This information is not intended to replace advice  given to you by your health care provider. Make sure you discuss any questions you have with your health care provider. °  °Document Released: 03/27/2005 Document Revised: 04/17/2014 Document Reviewed: 08/20/2013 °Elsevier Interactive Patient Education ©2016 [employer] ° °

## 2016-01-28 NOTE — Progress Notes (Signed)
Pt received from ED, alert and but not fully oriented. Vitals stable, no any specific complain of pain and anything at this time, Pt is in NPO for possible procedure tomorrow, admission assessment is due because pt is not fully oriented, will pass on to the next shift when family members are available, will continue to monitor the patient.

## 2016-02-03 ENCOUNTER — Ambulatory Visit: Payer: Medicare Other

## 2016-02-03 ENCOUNTER — Other Ambulatory Visit: Payer: Medicare Other

## 2016-02-08 ENCOUNTER — Other Ambulatory Visit: Payer: Self-pay | Admitting: Physician Assistant

## 2016-02-09 ENCOUNTER — Inpatient Hospital Stay (HOSPITAL_COMMUNITY): Admission: RE | Admit: 2016-02-09 | Payer: Medicare Other | Source: Ambulatory Visit

## 2016-02-09 ENCOUNTER — Ambulatory Visit (HOSPITAL_COMMUNITY): Admission: RE | Admit: 2016-02-09 | Payer: Medicare Other | Source: Ambulatory Visit

## 2016-02-10 ENCOUNTER — Other Ambulatory Visit: Payer: Medicare Other

## 2016-02-10 ENCOUNTER — Ambulatory Visit: Payer: Medicare Other | Admitting: Hematology

## 2016-02-17 ENCOUNTER — Encounter (HOSPITAL_COMMUNITY): Payer: Self-pay

## 2016-02-23 ENCOUNTER — Encounter (HOSPITAL_COMMUNITY): Payer: Self-pay

## 2016-03-01 ENCOUNTER — Encounter (HOSPITAL_COMMUNITY): Payer: Self-pay

## 2016-03-10 DEATH — deceased

## 2018-03-02 IMAGING — MR MR ABDOMEN WO/W CM
8 of 18 series · 20 of 48 positions shown · IV contrast (Yes   EOVIST)
Comparison: CT 12/22/2015

CLINICAL DATA: Indeterminate hepatic mass. Concern for metastatic
carcinoma.

EXAM:
MRI ABDOMEN WITHOUT AND WITH CONTRAST
TECHNIQUE: Multiplanar multisequence MR imaging of the abdomen was performed
both before and after the administration of intravenous contrast.
CONTRAST:  10 mL Eovist

[Series 3: ax dualecho · axial · 5.0mm · 0.78mm/px · z∈[-133,+112]mm · 4 of 100 slices shown]
[im 1/100]
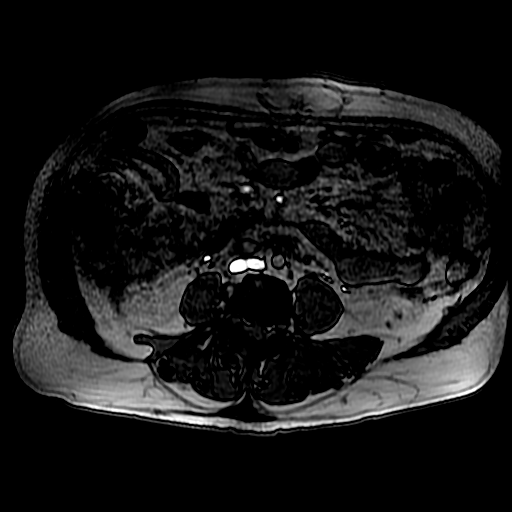
[im 34/100]
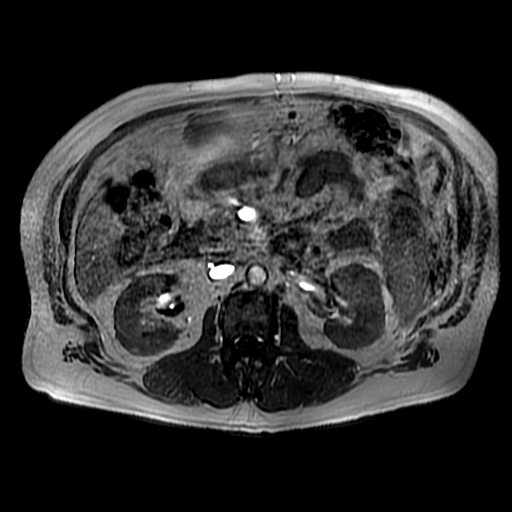
[im 67/100]
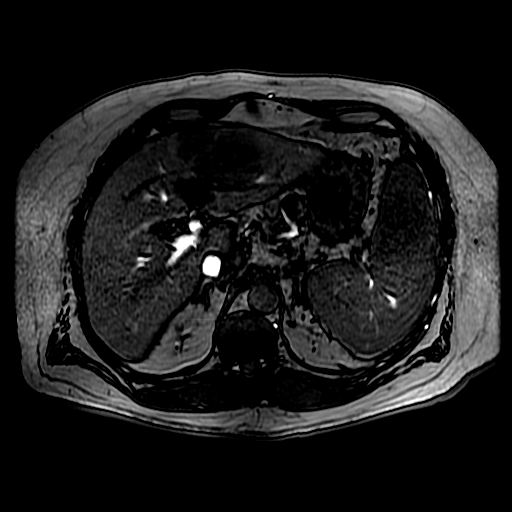
[im 100/100]
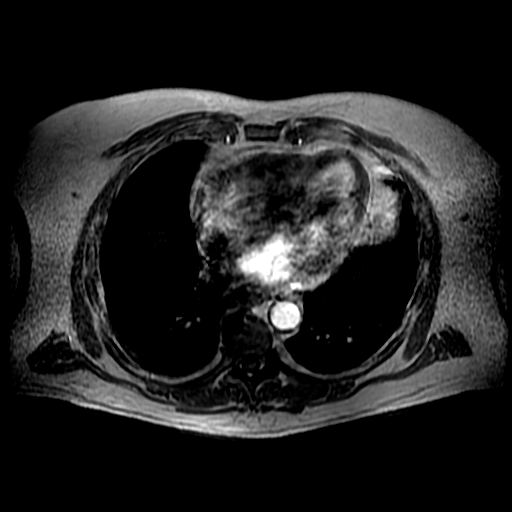

[Series 6: T1 dynamic post-contrast · coronal · 5.0mm · 0.78mm/px · 3 of 96 slices shown]
[im 1/96]
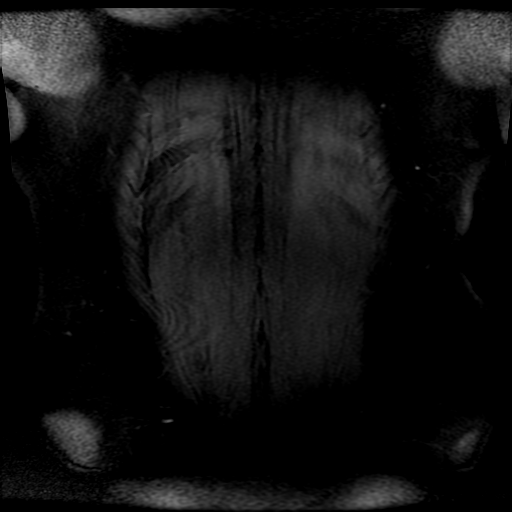
[im 48/96]
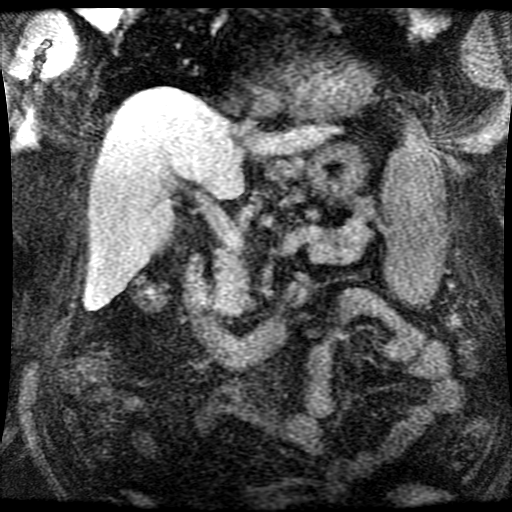
[im 96/96]
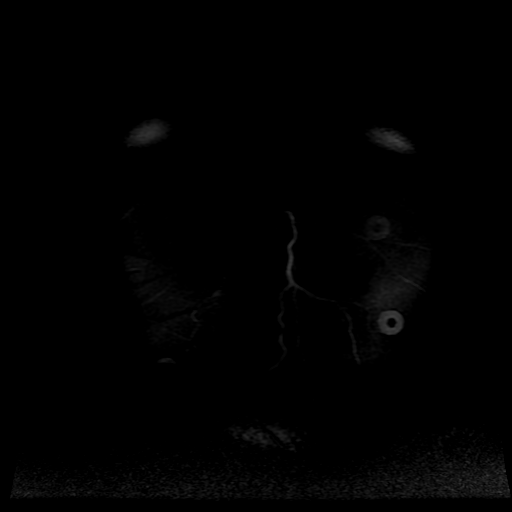

[Series 9: T2 fat-sat · axial · 5.0mm · 0.78mm/px · z∈[-153,+142]mm · 2 of 60 slices shown]
[im 1/60]
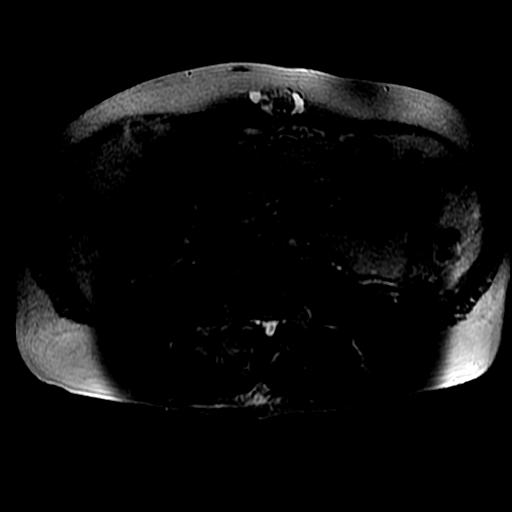
[im 60/60]
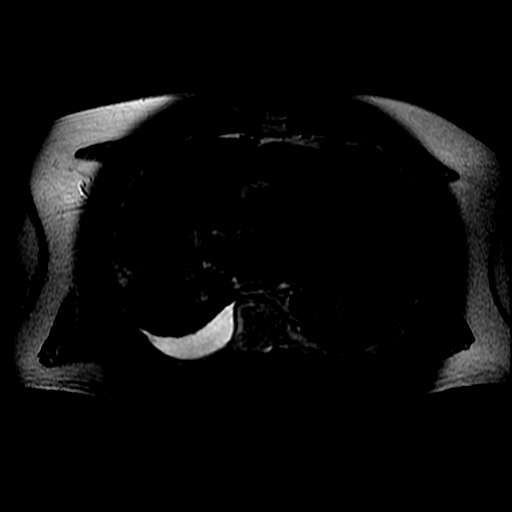

[Series 10: DWI b500 · axial · 6.0mm · 1.48mm/px · z∈[-131,+174]mm · 2 of 80 slices shown]
[im 1/80]
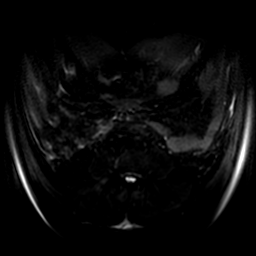
[im 80/80]
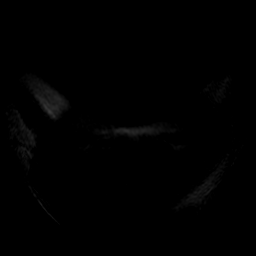

[Series 11: T2 · axial · 5.0mm · 0.78mm/px · z∈[-153,+142]mm · 2 of 60 slices shown (1 of 2)]
[im 1/60]
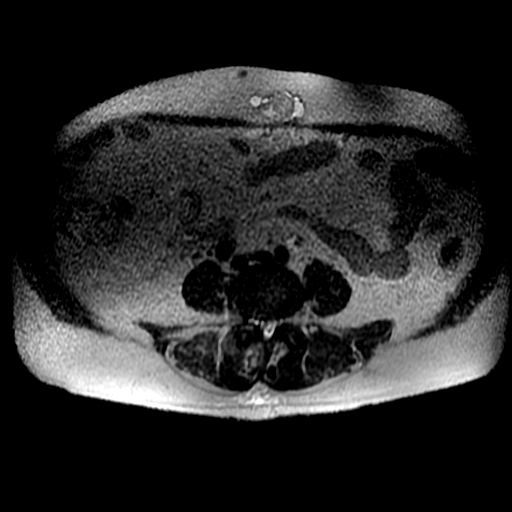
[im 60/60]
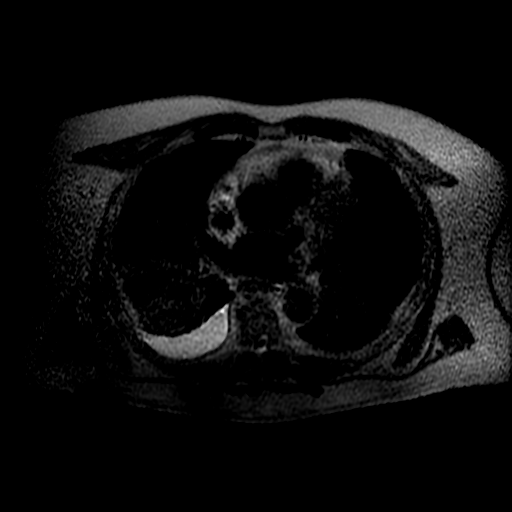

[Series 12: T1 dynamic · axial · 5.0mm · 0.78mm/px · z∈[-133,+104]mm · 3 of 96 slices shown (1 of 2)]
[im 1/96]
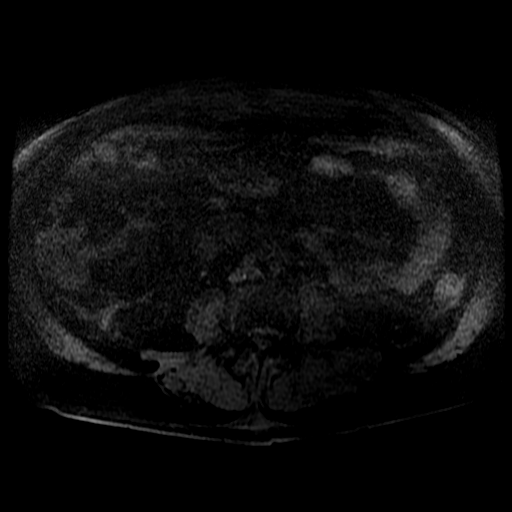
[im 48/96]
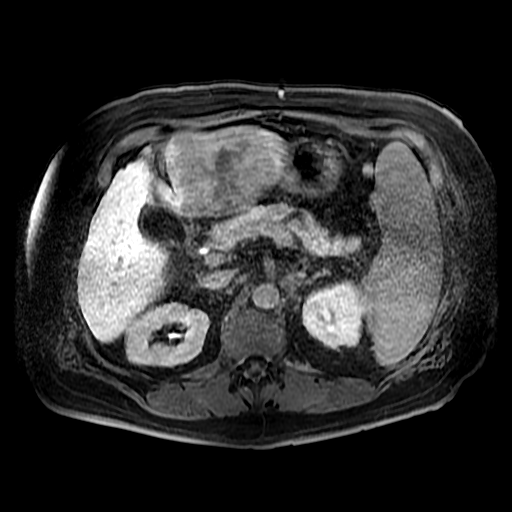
[im 96/96]
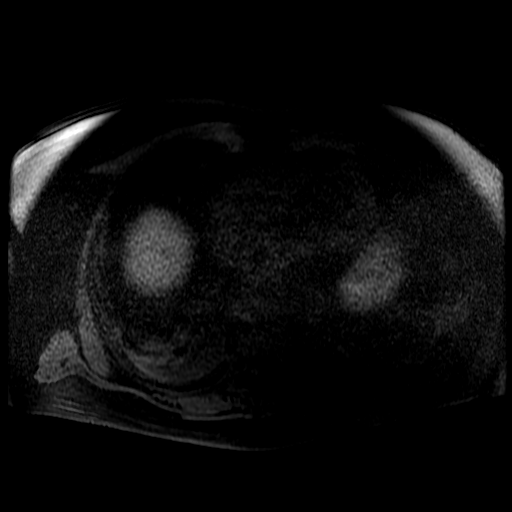

[Series 13: T2 · coronal · 5.0mm · 0.78mm/px · 1 of 45 slices shown (2 of 2)]
[im 1/45]
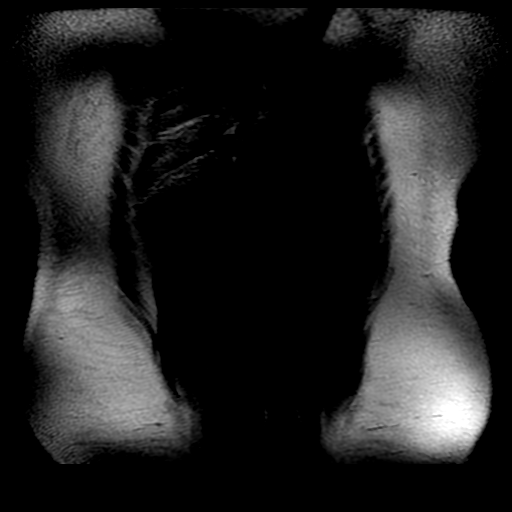

[Series 500: T1 dynamic · axial · 5.0mm · 0.78mm/px · z∈[-133,+104]mm · 3 of 96 slices shown (2 of 2)]
[im 1/96]
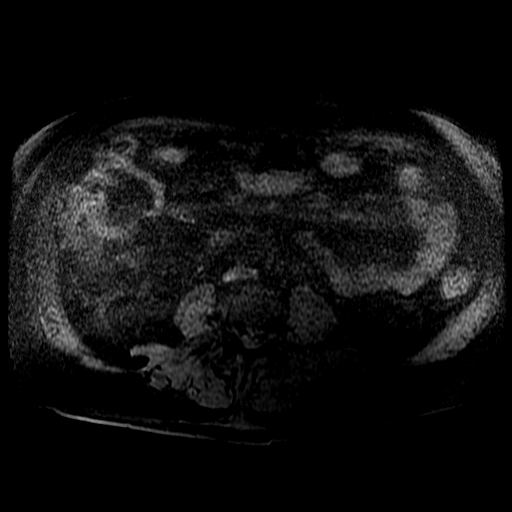
[im 48/96]
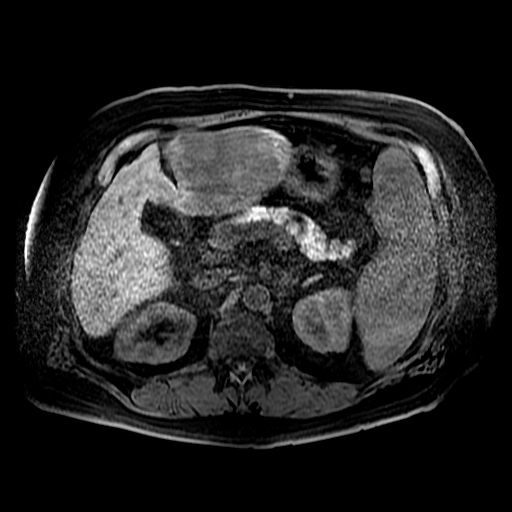
[im 96/96]
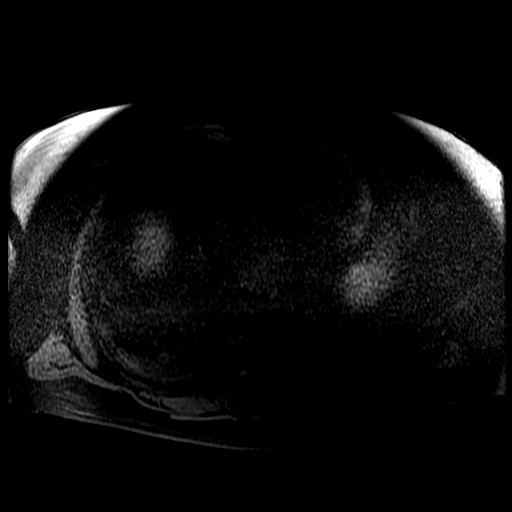

[20 of 48 positions shown; findings below may reference images not displayed]

FINDINGS: Lower chest: Small RIGHT effusion. Airspace disease in the RIGHT
lower lobe.

Hepatobiliary: Lobular mass occupies the near entirety of the LEFT
lateral hepatic lobe (segment 3). Mass is mildly hyperintense on T2
weighted imaging to normal liver parenchyma and measuring 10.3 by
6.9 cm in axial dimension. Second similar lesion in the central LEFT
hepatic lobe (segment 4A measures 1.8 cm). These lesions demonstrate
a peripheral continuous enhancement consistent with malignancy
(series 501).

Portal veins are patent.  No ascites.

There is small amount pericholecystic fluid. Gallstones in the
gallbladder. Gallbladder is mildly distended to 4 cm. Common bile
duct is normal caliber.

Pancreas: Normal pancreatic parenchymal intensity. No ductal
dilatation or inflammation. Benign-appearing cyst measuring 12 mm in
the body pancreas (image 29, series 13)

Spleen: Spleen is enlarged.  Splenic vein is patent.

Adrenals/urinary tract: Adrenal glands and kidneys are normal.

Stomach/Bowel: Stomach and limited of the small bowel is
unremarkable

Vascular/Lymphatic: Mild venous collaterals in the gastrohepatic
ligament.

Musculoskeletal: No aggressive osseous lesion
IMPRESSION: 1. Large enhancing lesion in the lateral segment of the LEFT hepatic
lobe and similar enhancing lesion in the central LEFT hepatic lobe
consistent with malignancy. Differential consideration would include
hepatocellular carcinoma, cholangiocarcinoma, or metastatic disease
to liver. With evidence of underlying liver dysfunction, favor HCC.
2. Pericholecystic fluid and gallstones. Correlate for
cholecystitis.
3. Benign-appearing cyst in the pancreas .
4. Splenomegaly and venous collaterals.

## 2018-05-16 ENCOUNTER — Encounter: Payer: Self-pay | Admitting: Hematology

## 2018-05-16 ENCOUNTER — Other Ambulatory Visit: Payer: Self-pay | Admitting: Nurse Practitioner
